# Patient Record
Sex: Male | Born: 1985 | Race: White | Hispanic: No | Marital: Married | Smoking: Never smoker
Health system: Southern US, Community
[De-identification: ages and names within clinical notes are randomized; demographics above are authoritative.]

## PROBLEM LIST (undated history)

## (undated) DIAGNOSIS — E7800 Pure hypercholesterolemia, unspecified: Secondary | ICD-10-CM

## (undated) DIAGNOSIS — M6740 Ganglion, unspecified site: Secondary | ICD-10-CM

## (undated) DIAGNOSIS — H6120 Impacted cerumen, unspecified ear: Secondary | ICD-10-CM

## (undated) HISTORY — DX: Ganglion, unspecified site: M67.40

## (undated) HISTORY — DX: Pure hypercholesterolemia, unspecified: E78.00

## (undated) HISTORY — DX: Impacted cerumen, unspecified ear: H61.20

---

## 2008-12-26 ENCOUNTER — Inpatient Hospital Stay: Payer: Self-pay | Admitting: Internal Medicine

## 2009-01-08 ENCOUNTER — Ambulatory Visit: Payer: Self-pay | Admitting: General Practice

## 2011-02-28 IMAGING — US ABDOMEN ULTRASOUND
1 series · 13 of 25 positions shown · non-contrast
Comparison: none

REASON FOR EXAM: abd pain  EVAL gallbadder
COMMENTS:

[Series 1: abdomen ultrasound · 0.35mm/px · 13 of 57 slices shown]
[im 1/57]
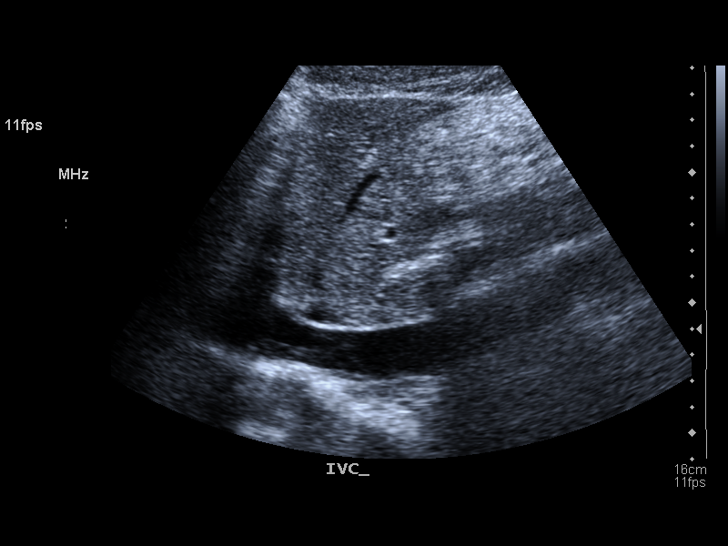
[im 5/57]
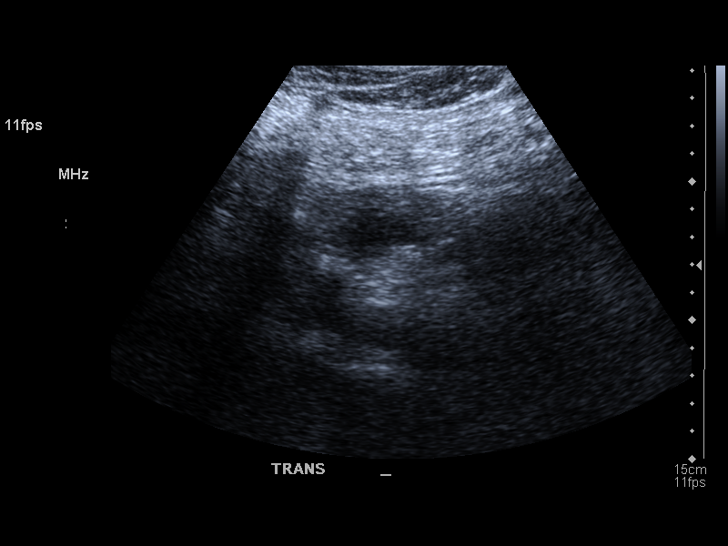
[im 10/57]
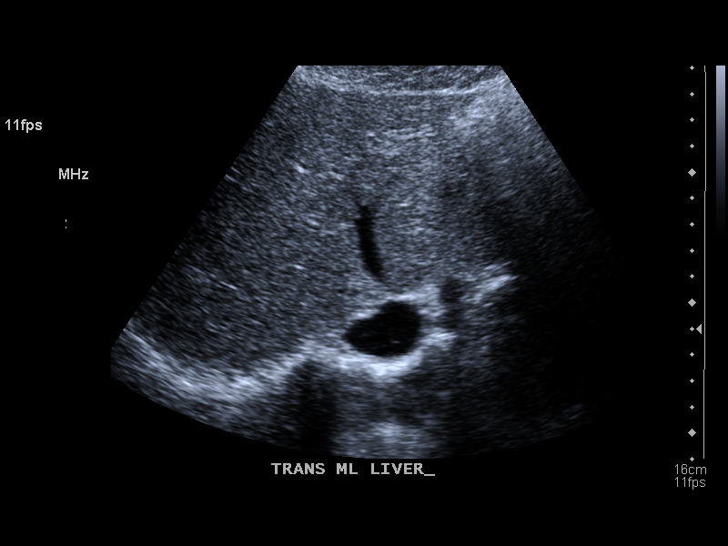
[im 15/57]
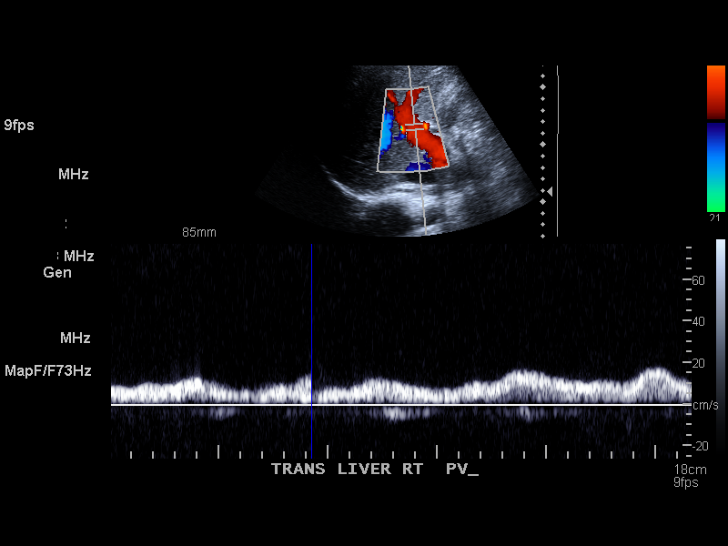
[im 19/57]
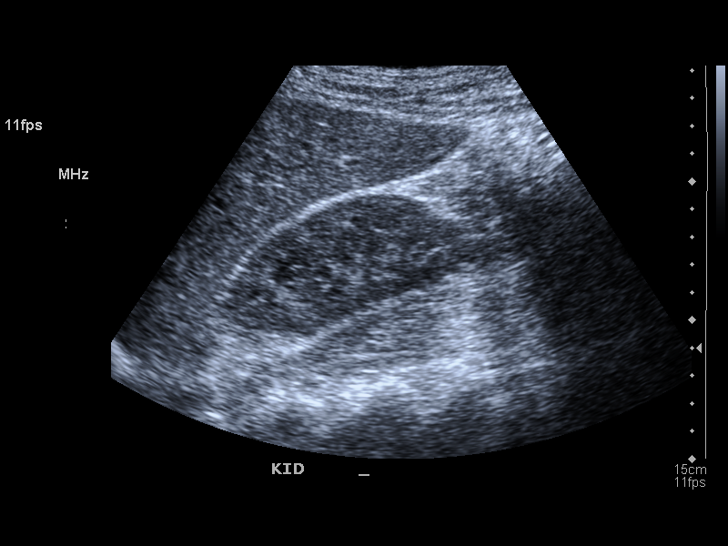
[im 24/57]
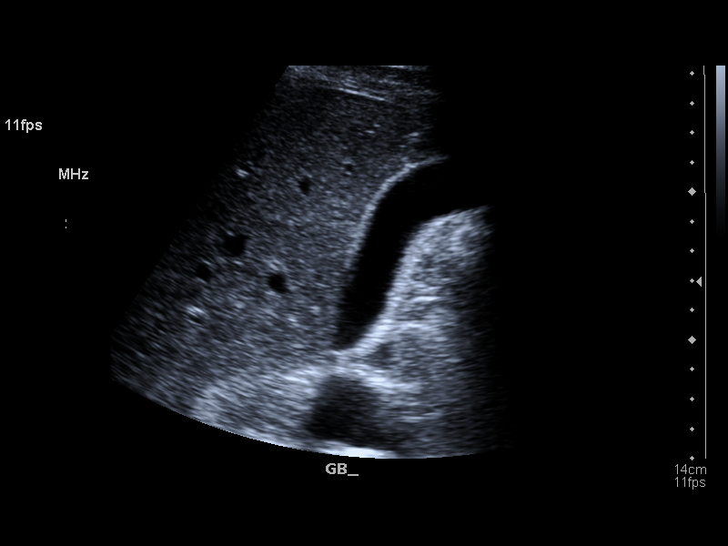
[im 29/57]
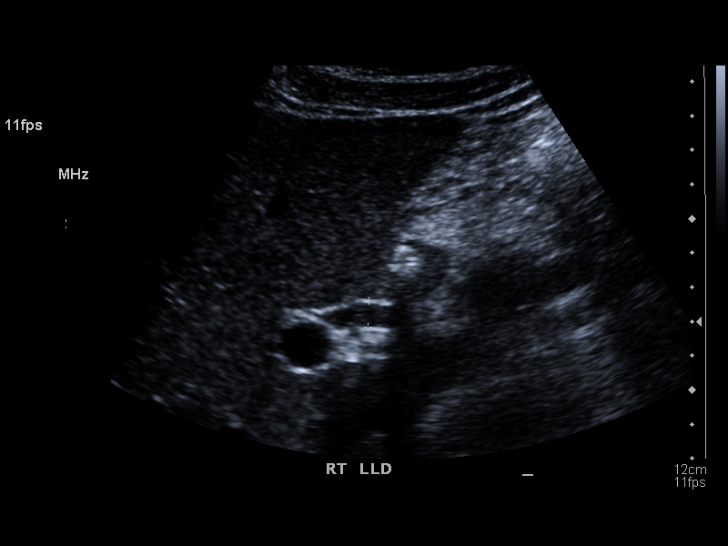
[im 33/57]
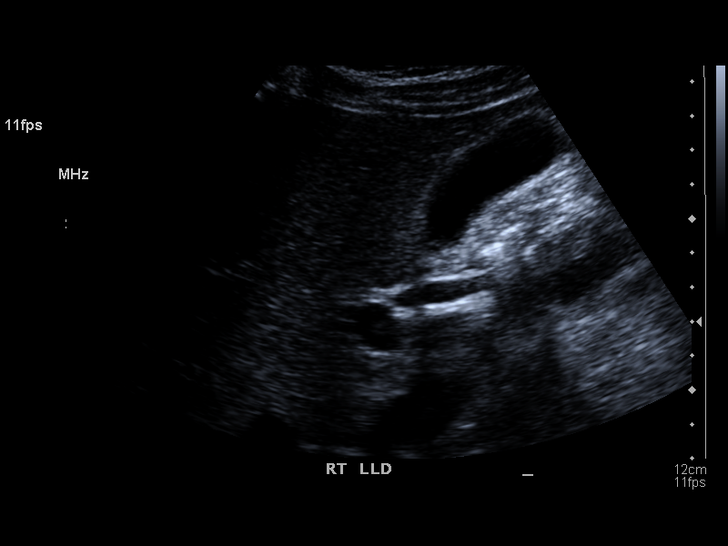
[im 38/57]
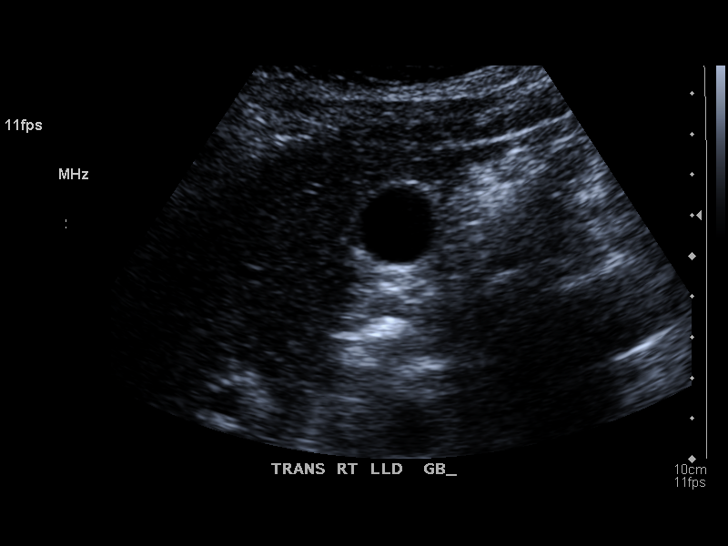
[im 43/57]
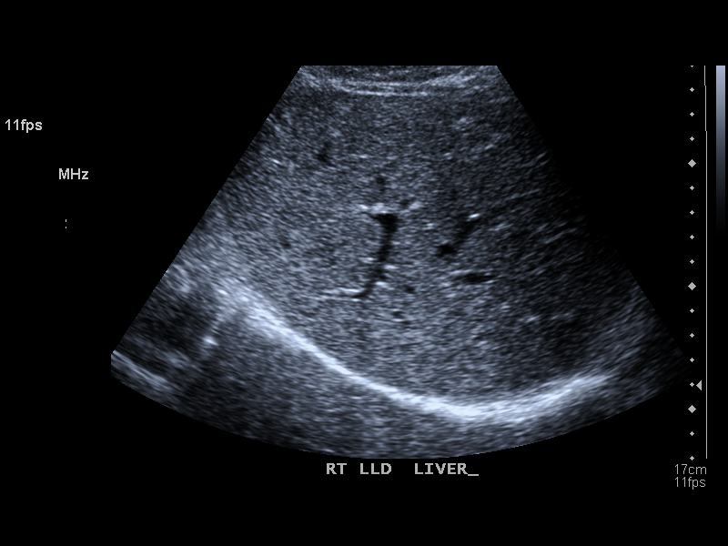
[im 47/57]
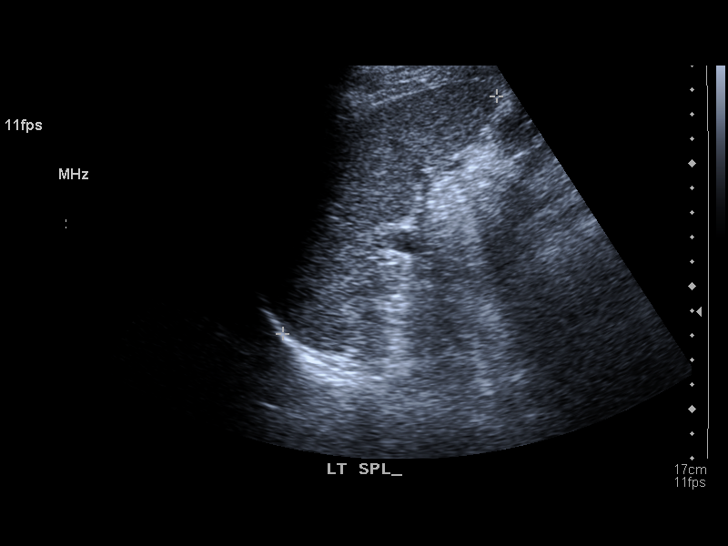
[im 52/57]
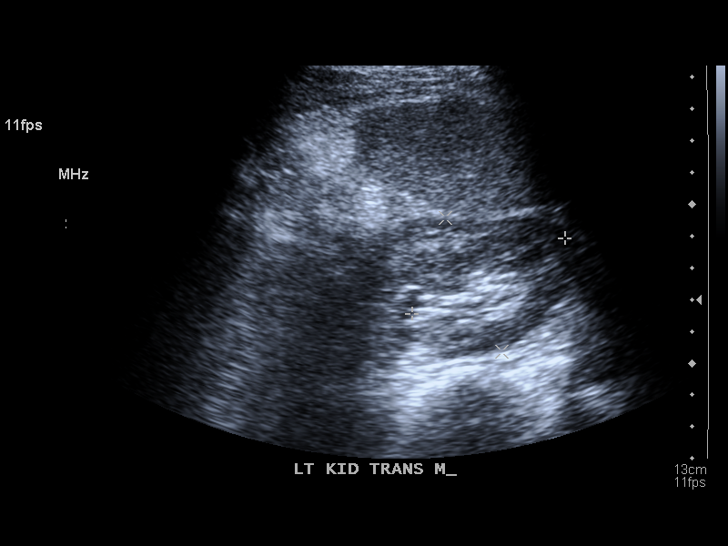
[im 57/57]
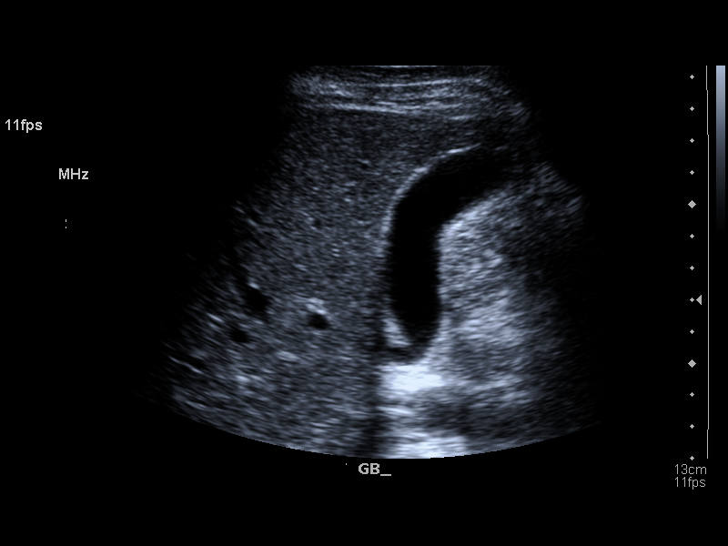

[13 of 25 positions shown; findings below may reference images not displayed]

PROCEDURE:     US  - US ABDOMEN GENERAL SURVEY  - January 08, 2009  [DATE]

RESULT:     The liver, pancreas, abdominal aorta and inferior vena cava show
no significant abnormalities. The spleen is upper limits for normal in size
and measures approximately 13 cm in AP diameter. No gallstones are seen.
There is no thickening of the gallbladder wall. The common bile duct on this
exam measures 7.7 mm at maximum diameter. This is large for the patient's
age. The common duct measured 9 mm at maximum diameter on the prior exam of
12/27/2008 but the difference observed is likely due to difference in
measurement technique. If there are laboratory findings to suggest partial
common duct obstruction then further evaluation by biliary nuclide scan is
recommended. The kidneys show no hydronephrosis. There is no ascites.
IMPRESSION: 1.  There persists dilatation of the common bile duct. If there are
laboratory values suggesting common duct obstruction then further evaluation
by biliary nuclide scan is recommended.
2.  No gallstones are seen.
3.  There is no thickening of the gallbladder wall.
4.  The spleen is upper limits for normal in size or slightly enlarged but
is stable as compared to the prior exam.

## 2013-09-13 ENCOUNTER — Emergency Department: Payer: Self-pay | Admitting: Emergency Medicine

## 2013-09-13 LAB — COMPREHENSIVE METABOLIC PANEL
Albumin: 4.3 g/dL (ref 3.4–5.0)
Alkaline Phosphatase: 65 U/L
BUN: 21 mg/dL — ABNORMAL HIGH (ref 7–18)
Bilirubin,Total: 1.3 mg/dL — ABNORMAL HIGH (ref 0.2–1.0)
Calcium, Total: 9.2 mg/dL (ref 8.5–10.1)
Chloride: 105 mmol/L (ref 98–107)
EGFR (African American): >60
EGFR (Non-African Amer.): >60
Potassium: 4 mmol/L (ref 3.5–5.1)
SGOT(AST): 17 U/L (ref 15–37)
SGPT (ALT): 35 U/L (ref 12–78)
Sodium: 137 mmol/L (ref 136–145)
Total Protein: 7.6 g/dL (ref 6.4–8.2)

## 2013-09-13 LAB — CBC WITH DIFFERENTIAL/PLATELET
Basophil #: 0 10*3/uL (ref 0.0–0.1)
Basophil %: 0.2 %
Eosinophil #: 0 10*3/uL (ref 0.0–0.7)
Eosinophil %: 0.4 %
HGB: 16 g/dL (ref 13.0–18.0)
Lymphocyte #: 0.1 10*3/uL — ABNORMAL LOW (ref 1.0–3.6)
MCHC: 34.4 g/dL (ref 32.0–36.0)
Monocyte %: 4.3 %
Neutrophil #: 8.4 10*3/uL — ABNORMAL HIGH (ref 1.4–6.5)
Neutrophil %: 93.5 %
Platelet: 195 10*3/uL (ref 150–440)
RBC: 5.61 10*6/uL (ref 4.40–5.90)
RDW: 13 % (ref 11.5–14.5)
WBC: 8.9 10*3/uL (ref 3.8–10.6)

## 2013-09-13 LAB — LIPASE, BLOOD: Lipase: 74 U/L (ref 73–393)

## 2017-04-19 ENCOUNTER — Encounter: Payer: Self-pay | Admitting: Emergency Medicine

## 2017-04-19 ENCOUNTER — Ambulatory Visit
Admission: EM | Admit: 2017-04-19 | Discharge: 2017-04-19 | Disposition: A | Discharge status: Home / Self Care | Payer: BLUE CROSS/BLUE SHIELD | Attending: Family Medicine | Admitting: Family Medicine

## 2017-04-19 DIAGNOSIS — S80862A Insect bite (nonvenomous), left lower leg, initial encounter: Secondary | ICD-10-CM | POA: Diagnosis not present

## 2017-04-19 DIAGNOSIS — L03116 Cellulitis of left lower limb: Secondary | ICD-10-CM

## 2017-04-19 DIAGNOSIS — W57XXXA Bitten or stung by nonvenomous insect and other nonvenomous arthropods, initial encounter: Secondary | ICD-10-CM

## 2017-04-19 LAB — CBC WITH DIFFERENTIAL/PLATELET
Basophils Absolute: 0 10*3/uL (ref 0–0.1)
Basophils Relative: 1 %
Eosinophils Absolute: 0 10*3/uL (ref 0–0.7)
Eosinophils Relative: 1 %
HCT: 44.9 % (ref 40.0–52.0)
Hemoglobin: 15.4 g/dL (ref 13.0–18.0)
Lymphocytes Relative: 13 %
Lymphs Abs: 0.7 10*3/uL — ABNORMAL LOW (ref 1.0–3.6)
MCH: 28.6 pg (ref 26.0–34.0)
MCHC: 34.4 g/dL (ref 32.0–36.0)
MCV: 83.1 fL (ref 80.0–100.0)
Monocytes Absolute: 0.7 10*3/uL (ref 0.2–1.0)
Monocytes Relative: 13 %
Neutro Abs: 3.7 10*3/uL (ref 1.4–6.5)
Neutrophils Relative %: 72 %
Platelets: 186 10*3/uL (ref 150–440)
RBC: 5.41 MIL/uL (ref 4.40–5.90)
RDW: 12.8 % (ref 11.5–14.5)
WBC: 5.2 10*3/uL (ref 3.8–10.6)

## 2017-04-19 MED ORDER — CEFUROXIME AXETIL 500 MG PO TABS: 500.0000 mg | ORAL_TABLET | Freq: Two times a day (BID) | ORAL | 0 refills | Status: AC

## 2017-04-19 MED ORDER — MUPIROCIN 2 % EX OINT: 1.0000 "application " | TOPICAL_OINTMENT | Freq: Two times a day (BID) | CUTANEOUS | 0 refills | Status: AC

## 2017-04-19 MED ORDER — SULFAMETHOXAZOLE-TRIMETHOPRIM 800-160 MG PO TABS: 1.0000 | ORAL_TABLET | Freq: Two times a day (BID) | ORAL | 0 refills | Status: AC

## 2017-04-19 MED ORDER — CEFTRIAXONE SODIUM 1 G IJ SOLR
1.0000 g | Freq: Once | INTRAMUSCULAR | Status: AC
  Administered 2017-04-19: 1 g via INTRAMUSCULAR

## 2017-04-19 NOTE — ED Provider Notes (Signed)
CSN: [id number]     Arrival date & time 04/19/17  1652 History   None    Chief Complaint  Patient presents with  . Insect Bite   (Consider location/radiation/quality/duration/timing/severity/associated sxs/prior Treatment) 31 yr old male pt [facility]  [facility] with cc of bug bite to left anterior shin ~ 2 weeks prior, tx with keflex x 4 days with no improvement, pt reports HA and fever now.    The history is provided by the patient. No language interpreter was used.    History reviewed. No pertinent past medical history. History reviewed. No pertinent surgical history. History reviewed. No pertinent family history. Social History  Substance Use Topics  . Smoking status: Never Smoker  . Smokeless tobacco: Never Used  . Alcohol use Yes    Review of Systems  Constitutional: Negative for chills and fever.  HENT: Negative for ear pain and sore throat.   Eyes: Negative for pain and visual disturbance.  Respiratory: Negative for cough and shortness of breath.   Cardiovascular: Negative for chest pain and palpitations.  Gastrointestinal: Negative for abdominal pain and vomiting.  Genitourinary: Negative for dysuria and hematuria.  Musculoskeletal: Negative for arthralgias and back pain.  Skin: Negative for color change and rash.  Neurological: Negative for seizures and syncope.  All other systems reviewed and are negative.   Allergies  Patient has no known allergies.  Home Medications   Prior to Admission medications   Medication Sig Start Date End Date Taking? Authorizing Provider  cefUROXime (CEFTIN) 500 MG tablet Take 1 tablet (500 mg total) by mouth 2 (two) times daily with a meal. 04/19/17 04/29/17  Victoria Euceda, Para March, NP  mupirocin ointment (BACTROBAN) 2 % Apply 1 application topically 2 (two) times daily. 04/19/17 04/29/17  Audryanna Zurita, Para March, NP  sulfamethoxazole-trimethoprim (BACTRIM DS,SEPTRA DS) 800-160 MG tablet Take 1 tablet by mouth 2 (two) times daily. 04/19/17 04/29/17   Jacey Pelc, Para March, NP   Meds Ordered and Administered this Visit   Medications  cefTRIAXone (ROCEPHIN) injection 1 g (1 g Intramuscular Given 04/19/17 1751)    BP 117/76 (BP Location: Left Arm)   Pulse 95   Temp 100.3 F (37.9 C) (Oral)   Resp 16   Ht 6' (1.829 m)   Wt 160 lb (72.6 kg)   SpO2 100%   BMI 21.70 kg/m  No data found.   Physical Exam  Constitutional: He is oriented to person, place, and time. He appears well-developed and well-nourished. He is active and cooperative.  HENT:  Head: Normocephalic and atraumatic.  Eyes: Conjunctivae are normal.  Neck: Neck supple.  Cardiovascular: Normal rate, regular rhythm and normal pulses.   No murmur heard. Pulses:      Dorsalis pedis pulses are 2+ on the right side, and 2+ on the left side.  Pulmonary/Chest: Effort normal and breath sounds normal. No respiratory distress.  Abdominal: Soft. There is no tenderness.  Musculoskeletal: He exhibits no edema.  Neurological: He is alert and oriented to person, place, and time. GCS eye subscore is 4. GCS verbal subscore is 5. GCS motor subscore is 6.  Skin: Skin is warm and dry. Lesion noted.     Psychiatric: He has a normal mood and affect. His speech is normal and behavior is normal.  Nursing note and vitals reviewed.   Urgent Care Course     Procedures (including critical care time)  Labs Review Labs Reviewed  CBC WITH DIFFERENTIAL/PLATELET - Abnormal; Notable for the following:       Result Value  Lymphs Abs 0.7 (*)    All other components within normal limits    Imaging Review No results found.       MDM   1. Insect bite, initial encounter   2. Cellulitis of left lower extremity    Stop Keflex switching to Ceftin and Septra, apply mupirocin abx ointment topically bid to affected area.. Your CBC was normal. Follow up with PCP for recheck in 2 days, sooner if worse.    Clancy Gourdefelice, Amyla Heffner, NP 04/19/17 1943

## 2017-04-19 NOTE — ED Triage Notes (Signed)
Patient c/o insect bite for the past 2 weeks ago on his left lower leg.  Patient started on an antibiotic since Friday.  Patient reports chills and HAs.

## 2017-04-19 NOTE — Discharge Instructions (Addendum)
Stop Keflex switching to Ceftin and Septra, apply mupirocin abx ointment topically bid to affected area.. Your CBC was normal. Follow up with PCP for recheck in 2 days, sooner if worse.

## 2019-07-17 ENCOUNTER — Ambulatory Visit: Payer: Self-pay

## 2019-07-17 DIAGNOSIS — Z23 Encounter for immunization: Secondary | ICD-10-CM

## 2020-01-22 DIAGNOSIS — H5213 Myopia, bilateral: Secondary | ICD-10-CM | POA: Diagnosis not present

## 2020-02-11 ENCOUNTER — Other Ambulatory Visit: Payer: Self-pay

## 2020-02-11 ENCOUNTER — Encounter: Payer: Self-pay | Admitting: Emergency Medicine

## 2020-02-11 ENCOUNTER — Emergency Department
Admission: EM | Admit: 2020-02-11 | Discharge: 2020-02-11 | Disposition: A | Discharge status: Home / Self Care | Payer: 59 | Attending: Emergency Medicine | Admitting: Emergency Medicine

## 2020-02-11 DIAGNOSIS — R52 Pain, unspecified: Secondary | ICD-10-CM | POA: Diagnosis not present

## 2020-02-11 DIAGNOSIS — I959 Hypotension, unspecified: Secondary | ICD-10-CM | POA: Diagnosis not present

## 2020-02-11 DIAGNOSIS — R1110 Vomiting, unspecified: Secondary | ICD-10-CM | POA: Diagnosis present

## 2020-02-11 DIAGNOSIS — R000 Tachycardia, unspecified: Secondary | ICD-10-CM | POA: Diagnosis not present

## 2020-02-11 DIAGNOSIS — R197 Diarrhea, unspecified: Secondary | ICD-10-CM | POA: Diagnosis not present

## 2020-02-11 DIAGNOSIS — R112 Nausea with vomiting, unspecified: Secondary | ICD-10-CM | POA: Insufficient documentation

## 2020-02-11 DIAGNOSIS — R1084 Generalized abdominal pain: Secondary | ICD-10-CM | POA: Diagnosis not present

## 2020-02-11 DIAGNOSIS — R1111 Vomiting without nausea: Secondary | ICD-10-CM | POA: Diagnosis not present

## 2020-02-11 DIAGNOSIS — K529 Noninfective gastroenteritis and colitis, unspecified: Secondary | ICD-10-CM | POA: Diagnosis not present

## 2020-02-11 LAB — CBC
HCT: 48.2 % (ref 39.0–52.0)
Hemoglobin: 16.9 g/dL (ref 13.0–17.0)
MCH: 28.4 pg (ref 26.0–34.0)
MCHC: 35.1 g/dL (ref 30.0–36.0)
MCV: 80.9 fL (ref 80.0–100.0)
Platelets: 287 10*3/uL (ref 150–400)
RBC: 5.96 MIL/uL — ABNORMAL HIGH (ref 4.22–5.81)
RDW: 12.3 % (ref 11.5–15.5)
WBC: 9.9 10*3/uL (ref 4.0–10.5)
nRBC: 0 % (ref 0.0–0.2)

## 2020-02-11 LAB — COMPREHENSIVE METABOLIC PANEL
ALT: 30 U/L (ref 0–44)
AST: 23 U/L (ref 15–41)
Albumin: 4.5 g/dL (ref 3.5–5.0)
Alkaline Phosphatase: 44 U/L (ref 38–126)
Anion gap: 9 (ref 5–15)
BUN: 23 mg/dL — ABNORMAL HIGH (ref 6–20)
CO2: 22 mmol/L (ref 22–32)
Calcium: 8.3 mg/dL — ABNORMAL LOW (ref 8.9–10.3)
Chloride: 111 mmol/L (ref 98–111)
Creatinine, Ser: 1.17 mg/dL (ref 0.61–1.24)
GFR calc Af Amer: >60 mL/min (ref >60)
GFR calc non Af Amer: >60 mL/min (ref >60)
Glucose, Bld: 148 mg/dL — ABNORMAL HIGH (ref 70–99)
Potassium: 3.5 mmol/L (ref 3.5–5.1)
Sodium: 142 mmol/L (ref 135–145)
Total Bilirubin: 1.6 mg/dL — ABNORMAL HIGH (ref 0.3–1.2)
Total Protein: 7.4 g/dL (ref 6.5–8.1)

## 2020-02-11 LAB — LIPASE, BLOOD: Lipase: 18 U/L (ref 11–51)

## 2020-02-11 MED ORDER — SODIUM CHLORIDE 0.9 % IV BOLUS
1000.0000 mL | Freq: Once | INTRAVENOUS | Status: AC
  Administered 2020-02-11: 1000 mL via INTRAVENOUS

## 2020-02-11 MED ORDER — SODIUM CHLORIDE 0.9 % IV BOLUS
1000.0000 mL | Freq: Once | INTRAVENOUS | Status: AC
  Administered 2020-02-11: 07:00:00 1000 mL via INTRAVENOUS

## 2020-02-11 MED ORDER — PROMETHAZINE HCL 25 MG/ML IJ SOLN
25.0000 mg | Freq: Once | INTRAMUSCULAR | Status: AC
  Administered 2020-02-11: 08:00:00 25 mg via INTRAVENOUS
  Filled 2020-02-11: qty 1

## 2020-02-11 MED ORDER — ONDANSETRON HCL 4 MG/2ML IJ SOLN
4.0000 mg | Freq: Once | INTRAMUSCULAR | Status: AC | PRN
  Administered 2020-02-11: 07:00:00 4 mg via INTRAVENOUS
  Filled 2020-02-11: qty 2

## 2020-02-11 MED ORDER — MORPHINE SULFATE (PF) 4 MG/ML IV SOLN
4.0000 mg | Freq: Once | INTRAVENOUS | Status: AC
  Administered 2020-02-11: 09:00:00 4 mg via INTRAVENOUS
  Filled 2020-02-11: qty 1

## 2020-02-11 MED ORDER — PROMETHAZINE HCL 25 MG PO TABS: 25.0000 mg | ORAL_TABLET | Freq: Four times a day (QID) | ORAL | 0 refills | Status: DC | PRN

## 2020-02-11 NOTE — ED Notes (Signed)
Family at bedside. 

## 2020-02-11 NOTE — ED Triage Notes (Signed)
Pt arrives via [facility] with N/V/D all night. Per EMS, VS WDL. Pt is currently sticking his finger down his throat to induce more vomiting. Pt is in NAD.

## 2020-02-11 NOTE — ED Provider Notes (Signed)
[facility] Emergency Department Provider Note  Time seen: 8:15 AM  I have reviewed the triage vital signs and the nursing notes.   HISTORY  Chief Complaint Emesis  HPI Randy Brewer is a 34 y.o. male with no significant past medical history presents to the emergency department for nausea vomiting and diarrhea.  According to the patient his daughter developed vomiting and diarrhea on Friday.  States he felt well until approximately 8 hours ago when he awoke from his sleep with nausea vomiting diarrhea.  States he has been up all night vomiting.  Has diffuse abdominal pain/cramping, but states the nausea and vomiting are not resolving.  Patient states he has been trying to make himself vomit because it seems to improve his symptoms release momentarily.  States he is also been experiencing diarrhea overnight.  No fever.  No cough.   History reviewed. No pertinent past medical history.  There are no problems to display for this patient.   History reviewed. No pertinent surgical history.  Prior to Admission medications   Not on File    No Known Allergies  No family history on file.  Social History Social History   Tobacco Use  . Smoking status: Never Smoker  . Smokeless tobacco: Never Used  Substance Use Topics  . Alcohol use: Yes  . Drug use: Never    Review of Systems Constitutional: Negative for fever. Cardiovascular: Negative for chest pain. Respiratory: Negative for shortness of breath. Gastrointestinal: Diffuse abdominal cramping.  Positive for nausea vomiting diarrhea Musculoskeletal: Negative for musculoskeletal complaints Neurological: Negative for headache All other ROS negative  ____________________________________________   PHYSICAL EXAM:  VITAL SIGNS: ED Triage Vitals [02/11/20 0630]  Enc Vitals Group     BP 106/73     Pulse Rate (!) 112     Resp 20     Temp 97.6 F (36.4 C)     Temp Source Oral     SpO2 96 %   Weight 165 lb (74.8 kg)     Height 5\' 11"  (1.803 m)     Head Circumference      Peak Flow      Pain Score      Pain Loc      Pain Edu?      Excl. in [place]?    Constitutional: Alert and oriented. Well appearing and in no distress. Eyes: Normal exam ENT      Head: Normocephalic and atraumatic.      Mouth/Throat: Mucous membranes are moist. Cardiovascular: Normal rate, regular rhythm.  Respiratory: Normal respiratory effort without tachypnea nor retractions. Breath sounds are clear Gastrointestinal: Soft, mild diffuse tenderness.  No rebound guarding or distention. Musculoskeletal: Nontender with normal range of motion in all extremities.  Neurologic:  Normal speech and language. No gross focal neurologic deficits  Skin:  Skin is warm, dry and intact.  Psychiatric: Mood and affect are normal.   ____________________________________________   INITIAL IMPRESSION / ASSESSMENT AND PLAN / ED COURSE  Pertinent labs & imaging results that were available during my care of the patient were reviewed by me and considered in my medical decision making (see chart for details).   Patient presents emergency department for nausea vomiting diarrhea.  Patient's symptoms started approximately 2 to 3 days after his daughter was sick with the same symptoms.  They were told that it is similar illnesses going around the daughter's daycare.  Patient symptoms are very suggestive of gastroenteritis possible norovirus.  Patient has no focal  area of pain identified on exam.  Does appear dehydrated on lab work we will dose IV fluids.  We will dose pain and nausea medication.  If the patient can provide a stool sample we will test.  Patient is feeling better, has not been able to provide a stool sample.  We will discharge with nausea medication and supportive care.  Discussed return precautions.  Randy Brewer was evaluated in Emergency Department on 02/11/2020 for the symptoms described in the history of present  illness. He was evaluated in the context of the global COVID-19 pandemic, which necessitated consideration that the patient might be at risk for infection with the SARS-CoV-2 virus that causes COVID-19. Institutional protocols and algorithms that pertain to the evaluation of patients at risk for COVID-19 are in a state of rapid change based on information released by regulatory bodies including the CDC and federal and state organizations. These policies and algorithms were followed during the patient's care in the ED.  ____________________________________________   FINAL CLINICAL IMPRESSION(S) / ED DIAGNOSES  Gastroenteritis   Minna Antis, MD 02/11/20 1317

## 2020-02-13 ENCOUNTER — Telehealth: Payer: Self-pay

## 2020-02-13 NOTE — Telephone Encounter (Signed)
Received [facility] ED notes dated 02/11/20. Notes reviewed 02/12/20 by Durward Parcel, PA-C.  AMD

## 2020-03-24 ENCOUNTER — Other Ambulatory Visit: Payer: Self-pay

## 2020-03-24 ENCOUNTER — Ambulatory Visit: Payer: Self-pay

## 2020-03-24 DIAGNOSIS — Z0000 Encounter for general adult medical examination without abnormal findings: Secondary | ICD-10-CM

## 2020-03-24 LAB — POCT URINALYSIS DIPSTICK
Bilirubin, UA: NEGATIVE
Blood, UA: NEGATIVE
Glucose, UA: NEGATIVE
Ketones, UA: NEGATIVE
Leukocytes, UA: NEGATIVE
Nitrite, UA: NEGATIVE
Protein, UA: NEGATIVE
Spec Grav, UA: 1.02 (ref 1.010–1.025)
Urobilinogen, UA: 0.2 E.U./dL
pH, UA: 6.5 (ref 5.0–8.0)

## 2020-03-24 NOTE — Progress Notes (Signed)
Scheduled to complete physical 04/02/20 with Ron Smith, PA-C.  AMD 

## 2020-03-25 LAB — CMP12+LP+TP+TSH+6AC+CBC/D/PLT
ALT: 16 IU/L (ref 0–44)
AST: 17 IU/L (ref 0–40)
Albumin/Globulin Ratio: 2 (ref 1.2–2.2)
Albumin: 4.8 g/dL (ref 4.0–5.0)
Alkaline Phosphatase: 62 IU/L (ref 48–121)
BUN/Creatinine Ratio: 14 (ref 9–20)
BUN: 15 mg/dL (ref 6–20)
Basophils Absolute: 0.1 10*3/uL (ref 0.0–0.2)
Basos: 1 %
Bilirubin Total: 0.9 mg/dL (ref 0.0–1.2)
Calcium: 9.5 mg/dL (ref 8.7–10.2)
Chloride: 100 mmol/L (ref 96–106)
Chol/HDL Ratio: 4.2 ratio (ref 0.0–5.0)
Cholesterol, Total: 212 mg/dL — ABNORMAL HIGH (ref 100–199)
Creatinine, Ser: 1.06 mg/dL (ref 0.76–1.27)
EOS (ABSOLUTE): 0.2 10*3/uL (ref 0.0–0.4)
Eos: 5 %
Estimated CHD Risk: 0.8 times avg. (ref 0.0–1.0)
Free Thyroxine Index: 2.5 (ref 1.2–4.9)
GFR calc Af Amer: 104 mL/min/{1.73_m2} (ref >59)
GFR calc non Af Amer: 90 mL/min/{1.73_m2} (ref >59)
GGT: 21 IU/L (ref 0–65)
Globulin, Total: 2.4 g/dL (ref 1.5–4.5)
Glucose: 85 mg/dL (ref 65–99)
HDL: 51 mg/dL (ref >39)
Hematocrit: 46.2 % (ref 37.5–51.0)
Hemoglobin: 15.8 g/dL (ref 13.0–17.7)
Immature Grans (Abs): 0 10*3/uL (ref 0.0–0.1)
Immature Granulocytes: 0 %
Iron: 113 ug/dL (ref 38–169)
LDH: 152 IU/L (ref 121–224)
LDL Chol Calc (NIH): 144 mg/dL — ABNORMAL HIGH (ref 0–99)
Lymphocytes Absolute: 1.3 10*3/uL (ref 0.7–3.1)
Lymphs: 25 %
MCH: 28.7 pg (ref 26.6–33.0)
MCHC: 34.2 g/dL (ref 31.5–35.7)
MCV: 84 fL (ref 79–97)
Monocytes Absolute: 0.4 10*3/uL (ref 0.1–0.9)
Monocytes: 7 %
Neutrophils Absolute: 3.3 10*3/uL (ref 1.4–7.0)
Neutrophils: 62 %
Phosphorus: 3 mg/dL (ref 2.8–4.1)
Platelets: 305 10*3/uL (ref 150–450)
Potassium: 4.4 mmol/L (ref 3.5–5.2)
RBC: 5.5 x10E6/uL (ref 4.14–5.80)
RDW: 12.5 % (ref 11.6–15.4)
Sodium: 138 mmol/L (ref 134–144)
T3 Uptake Ratio: 29 % (ref 24–39)
T4, Total: 8.5 ug/dL (ref 4.5–12.0)
TSH: 1.36 u[IU]/mL (ref 0.450–4.500)
Total Protein: 7.2 g/dL (ref 6.0–8.5)
Triglycerides: 94 mg/dL (ref 0–149)
Uric Acid: 4.7 mg/dL (ref 3.8–8.4)
VLDL Cholesterol Cal: 17 mg/dL (ref 5–40)
WBC: 5.2 10*3/uL (ref 3.4–10.8)

## 2020-04-02 ENCOUNTER — Encounter: Payer: Self-pay | Admitting: Physician Assistant

## 2020-04-02 ENCOUNTER — Other Ambulatory Visit: Payer: Self-pay

## 2020-04-02 ENCOUNTER — Ambulatory Visit: Payer: Self-pay | Admitting: Physician Assistant

## 2020-04-02 VITALS — BP 116/80 | HR 66 | Temp 97.5°F | Resp 12 | Ht 72.0 in | Wt 162.0 lb

## 2020-04-02 DIAGNOSIS — L255 Unspecified contact dermatitis due to plants, except food: Secondary | ICD-10-CM

## 2020-04-02 DIAGNOSIS — Z0000 Encounter for general adult medical examination without abnormal findings: Secondary | ICD-10-CM

## 2020-04-02 MED ORDER — METHYLPREDNISOLONE 4 MG PO TBPK: ORAL_TABLET | ORAL | 0 refills | Status: DC

## 2020-04-02 MED ORDER — METHYLPREDNISOLONE ACETATE 80 MG/ML IJ SUSP
80.0000 mg | Freq: Once | INTRAMUSCULAR | Status: AC
  Administered 2020-04-02: 80 mg via INTRAMUSCULAR

## 2020-04-02 MED ORDER — HYDROXYZINE HCL 50 MG PO TABS: 50.0000 mg | ORAL_TABLET | Freq: Three times a day (TID) | ORAL | 0 refills | Status: DC | PRN

## 2020-04-02 NOTE — Progress Notes (Signed)
40 mg (0/5 ml) of Depo Medrol administered in right deltoid for poison oak as ordered by Ron Smith,PA-C. 80 mg/ml vial used - only strength available in clinic.  Used 1/2 the dose.  AMD

## 2020-04-02 NOTE — Progress Notes (Signed)
   Subjective: Annual physical exam    Patient ID: Randy Brewer, male    DOB: [birth year], 34 y.o.   MRN: [id number]  HPI Patient presents for annual physical exam.  Patient voiced no concerns or complaints.   Review of Systems Rash right lower leg.    Objective:   Physical Exam No acute distress.  HEENT unremarkable.  Neck is supple for adenopathy or bruits.  Lungs are clear to auscultation.  Heart is regular rate and rhythm.  Abdomen with negative HSM, normoactive bowel sounds, soft, nontender to palpation.  No obvious deformity to the upper or lower extremities.  Patient has full and equal range of motion of the upper and lower extremities.  No obvious deformity to cervical and lumbar spine.  Patient has full and equal range of motion of the cervical lumbar spine.  Cranial nerves II through XII grossly intact.  Patient has vesicular lesions on erythematous base right lower leg.         Assessment & Plan: Well exam Discussed lab results and advised diet and exercise secondary to increase of cholesterol to 212.  Rash secondary to contact dermatitis.  Patient given a prescription for Medrol Dosepak and Atarax.  Patient was given Depo-Medrol prior to departure.

## 2020-07-24 ENCOUNTER — Other Ambulatory Visit: Payer: Self-pay

## 2020-07-24 DIAGNOSIS — Z1152 Encounter for screening for COVID-19: Secondary | ICD-10-CM

## 2020-07-24 NOTE — Addendum Note (Signed)
Addended by: Christianne Dolin F on: 07/24/2020 10:17 AM   Modules accepted: Orders

## 2020-07-26 LAB — SARS-COV-2, NAA 2 DAY TAT

## 2020-07-26 LAB — NOVEL CORONAVIRUS, NAA: SARS-CoV-2, NAA: NOT DETECTED

## 2021-03-12 NOTE — Progress Notes (Signed)
Scheduled to complete physical 03/19/21 with Blima Ledger, NP-C.  AMD

## 2021-03-13 ENCOUNTER — Ambulatory Visit: Payer: Self-pay

## 2021-03-13 ENCOUNTER — Other Ambulatory Visit: Payer: Self-pay

## 2021-03-13 DIAGNOSIS — Z0000 Encounter for general adult medical examination without abnormal findings: Secondary | ICD-10-CM

## 2021-03-13 LAB — POCT URINALYSIS DIPSTICK
Bilirubin, UA: NEGATIVE
Blood, UA: NEGATIVE
Glucose, UA: NEGATIVE
Ketones, UA: NEGATIVE
Leukocytes, UA: NEGATIVE
Nitrite, UA: NEGATIVE
Protein, UA: NEGATIVE
Spec Grav, UA: 1.025 (ref 1.010–1.025)
Urobilinogen, UA: 0.2 E.U./dL
pH, UA: 6 (ref 5.0–8.0)

## 2021-03-14 LAB — CMP12+LP+TP+TSH+6AC+CBC/D/PLT
ALT: 21 IU/L (ref 0–44)
AST: 18 IU/L (ref 0–40)
Albumin/Globulin Ratio: 2 (ref 1.2–2.2)
Albumin: 5.1 g/dL — ABNORMAL HIGH (ref 4.0–5.0)
Alkaline Phosphatase: 69 IU/L (ref 44–121)
BUN/Creatinine Ratio: 14 (ref 9–20)
BUN: 15 mg/dL (ref 6–20)
Basophils Absolute: 0.1 x10E3/uL (ref 0.0–0.2)
Basos: 1 %
Bilirubin Total: 0.7 mg/dL (ref 0.0–1.2)
Calcium: 9.5 mg/dL (ref 8.7–10.2)
Chloride: 99 mmol/L (ref 96–106)
Chol/HDL Ratio: 4.1 ratio (ref 0.0–5.0)
Cholesterol, Total: 214 mg/dL — ABNORMAL HIGH (ref 100–199)
Creatinine, Ser: 1.06 mg/dL (ref 0.76–1.27)
EOS (ABSOLUTE): 0.2 x10E3/uL (ref 0.0–0.4)
Eos: 3 %
Estimated CHD Risk: 0.8 times avg. (ref 0.0–1.0)
Free Thyroxine Index: 2.7 (ref 1.2–4.9)
GGT: 22 IU/L (ref 0–65)
Globulin, Total: 2.6 g/dL (ref 1.5–4.5)
Glucose: 89 mg/dL (ref 65–99)
HDL: 52 mg/dL
Hematocrit: 48.6 % (ref 37.5–51.0)
Hemoglobin: 16.2 g/dL (ref 13.0–17.7)
Immature Grans (Abs): 0 x10E3/uL (ref 0.0–0.1)
Immature Granulocytes: 0 %
Iron: 109 ug/dL (ref 38–169)
LDH: 154 IU/L (ref 121–224)
LDL Chol Calc (NIH): 142 mg/dL — ABNORMAL HIGH (ref 0–99)
Lymphocytes Absolute: 1.5 x10E3/uL (ref 0.7–3.1)
Lymphs: 25 %
MCH: 28.6 pg (ref 26.6–33.0)
MCHC: 33.3 g/dL (ref 31.5–35.7)
MCV: 86 fL (ref 79–97)
Monocytes Absolute: 0.4 x10E3/uL (ref 0.1–0.9)
Monocytes: 7 %
Neutrophils Absolute: 4.1 x10E3/uL (ref 1.4–7.0)
Neutrophils: 64 %
Phosphorus: 3.1 mg/dL (ref 2.8–4.1)
Platelets: 291 x10E3/uL (ref 150–450)
Potassium: 4.2 mmol/L (ref 3.5–5.2)
RBC: 5.67 x10E6/uL (ref 4.14–5.80)
RDW: 12.5 % (ref 11.6–15.4)
Sodium: 143 mmol/L (ref 134–144)
T3 Uptake Ratio: 27 % (ref 24–39)
T4, Total: 10 ug/dL (ref 4.5–12.0)
TSH: 1.4 u[IU]/mL (ref 0.450–4.500)
Total Protein: 7.7 g/dL (ref 6.0–8.5)
Triglycerides: 110 mg/dL (ref 0–149)
Uric Acid: 4.4 mg/dL (ref 3.8–8.4)
VLDL Cholesterol Cal: 20 mg/dL (ref 5–40)
WBC: 6.3 x10E3/uL (ref 3.4–10.8)
eGFR: 93 mL/min/1.73

## 2021-03-19 ENCOUNTER — Ambulatory Visit: Payer: Self-pay | Admitting: Adult Health

## 2021-03-19 ENCOUNTER — Encounter: Payer: Self-pay | Admitting: Adult Health

## 2021-03-19 ENCOUNTER — Other Ambulatory Visit: Payer: Self-pay

## 2021-03-19 VITALS — BP 126/82 | HR 71 | Temp 97.6°F | Resp 14 | Ht 71.0 in | Wt 165.0 lb

## 2021-03-19 DIAGNOSIS — E7800 Pure hypercholesterolemia, unspecified: Secondary | ICD-10-CM

## 2021-03-19 DIAGNOSIS — Z0000 Encounter for general adult medical examination without abnormal findings: Secondary | ICD-10-CM

## 2021-03-19 NOTE — Progress Notes (Signed)
Pt presents today to Complete physical with Randy Brewer

## 2021-03-19 NOTE — Progress Notes (Signed)
[facility] [address] [place] [address], [place] [address]  Internal MEDICINE  Office Visit Note  Patient Name: Randy Brewer  [id number]  [id number]  Date of Service: 03/19/2021  Chief Complaint  Patient presents with  . Annual Exam    Pt denies any issues or concerns at this time.     HPI Pt is here for routine health maintenance examination.  He is a 80 year veteran of the [employer].  He is married (5 years) and has one child from a previous marriage age 61 (son) and a two year old daughter.  His wife works for a [employer]. He denies any significant medical/sugical history. He does not take any medications daily. He denies tobacco or illicit drug use, he does use alcohol minimally.  Less than 5 drinks on an average week.   Current Medication: Outpatient Encounter Medications as of 03/19/2021  Medication Sig  . [DISCONTINUED] hydrOXYzine (ATARAX/VISTARIL) 50 MG tablet Take 1 tablet (50 mg total) by mouth 3 (three) times daily as needed for itching.  . [DISCONTINUED] methylPREDNISolone (MEDROL DOSEPAK) 4 MG TBPK tablet Take Tapered dose as directed   No facility-administered encounter medications on file as of 03/19/2021.    Surgical History: History reviewed. No pertinent surgical history.  Medical History: Past Medical History:  Diagnosis Date  . Cerumen impaction   . Elevated LDL cholesterol level   . Ganglion cyst     Family History: History reviewed. No pertinent family history.  Social History: Social History   Socioeconomic History  . Marital status: Married    Spouse name: Not on file  . Number of children: Not on file  . Years of education: Not on file  . Highest education level: Not on file  Occupational History  . Not on file  Tobacco Use  . Smoking status: Never Smoker  . Smokeless tobacco: Never Used  Substance and Sexual Activity  . Alcohol use: Yes  . Drug use: Never  . Sexual activity: Not on  file  Other Topics Concern  . Not on file  Social History Narrative  . Not on file   Social Determinants of Health   Financial Resource Strain: Not on file  Food Insecurity: Not on file  Transportation Needs: Not on file  Physical Activity: Not on file  Stress: Not on file  Social Connections: Not on file      Review of Systems  Constitutional: Negative for activity change, appetite change and fatigue.  HENT: Negative for congestion, sinus pain, trouble swallowing and voice change.   Eyes: Negative for pain, discharge and visual disturbance.  Respiratory: Negative for cough, chest tightness and shortness of breath.   Cardiovascular: Negative for chest pain and leg swelling.  Gastrointestinal: Negative for abdominal distention, abdominal pain, constipation and diarrhea.  Endocrine: Negative for cold intolerance and heat intolerance.  Genitourinary: Negative for difficulty urinating, flank pain, penile pain and testicular pain.  Musculoskeletal: Negative for arthralgias, back pain and neck pain.  Skin: Negative for color change.  Allergic/Immunologic: Negative for food allergies.  Neurological: Negative for dizziness, weakness and headaches.  Hematological: Negative for adenopathy.  Psychiatric/Behavioral: Negative for agitation, confusion and suicidal ideas.     Vital Signs: BP 126/82   Pulse 71   Temp 97.6 F (36.4 C)   Resp 14   Ht '5\' 11"'  (1.803 m)   Wt 165 lb (74.8 kg)   SpO2 100%   BMI 23.01 kg/m    Physical Exam  Constitutional:      Appearance: Normal appearance.  HENT:     Head: Normocephalic.     Right Ear: Tympanic membrane normal.     Left Ear: Tympanic membrane normal.     Nose: Nose normal.     Mouth/Throat:     Mouth: Mucous membranes are moist.     Pharynx: No oropharyngeal exudate or posterior oropharyngeal erythema.  Eyes:     General:        Right eye: No discharge.        Left eye: No discharge.     Extraocular Movements: Extraocular  movements intact.     Pupils: Pupils are equal, round, and reactive to light.  Cardiovascular:     Rate and Rhythm: Normal rate and regular rhythm.     Pulses: Normal pulses.     Heart sounds: Normal heart sounds. No murmur heard.   Pulmonary:     Effort: Pulmonary effort is normal. No respiratory distress.     Breath sounds: Normal breath sounds. No wheezing or rhonchi.  Abdominal:     General: Abdomen is flat. Bowel sounds are normal. There is no distension.     Palpations: There is no mass.     Tenderness: There is no abdominal tenderness. There is no guarding.     Hernia: No hernia is present. There is no hernia in the left inguinal area or right inguinal area.  Genitourinary:    Penis: Circumcised. No phimosis, hypospadias, erythema, tenderness, discharge or lesions.      Testes:        Right: Mass, tenderness or swelling not present.        Left: Mass, tenderness or swelling not present.     Epididymis:     Right: Normal.     Left: Normal.  Musculoskeletal:        General: No swelling or deformity. Normal range of motion.     Cervical back: Normal range of motion.  Lymphadenopathy:     Lower Body: No right inguinal adenopathy. No left inguinal adenopathy.  Skin:    General: Skin is warm and dry.     Capillary Refill: Capillary refill takes less than 2 seconds.  Neurological:     General: No focal deficit present.     Mental Status: He is alert.     Cranial Nerves: No cranial nerve deficit.     Gait: Gait normal.  Psychiatric:        Mood and Affect: Mood normal.        Behavior: Behavior normal.        Judgment: Judgment normal.      LABS: Recent Results (from the past 2160 hour(s))  CMP12+LP+TP+TSH+6AC+CBC/D/Plt     Status: Abnormal   Collection Time: 03/13/21  9:15 AM  Result Value Ref Range   Glucose 89 65 - 99 mg/dL   Uric Acid 4.4 3.8 - 8.4 mg/dL    Comment:            Therapeutic target for gout patients: <6.0   BUN 15 6 - 20 mg/dL   Creatinine, Ser  1.06 0.76 - 1.27 mg/dL   eGFR 93 >59 mL/min/1.73   BUN/Creatinine Ratio 14 9 - 20   Sodium 143 134 - 144 mmol/L   Potassium 4.2 3.5 - 5.2 mmol/L   Chloride 99 96 - 106 mmol/L   Calcium 9.5 8.7 - 10.2 mg/dL   Phosphorus 3.1 2.8 - 4.1 mg/dL   Total Protein 7.7 6.0 -  8.5 g/dL   Albumin 5.1 (H) 4.0 - 5.0 g/dL   Globulin, Total 2.6 1.5 - 4.5 g/dL   Albumin/Globulin Ratio 2.0 1.2 - 2.2   Bilirubin Total 0.7 0.0 - 1.2 mg/dL   Alkaline Phosphatase 69 44 - 121 IU/L   LDH 154 121 - 224 IU/L   AST 18 0 - 40 IU/L   ALT 21 0 - 44 IU/L   GGT 22 0 - 65 IU/L   Iron 109 38 - 169 ug/dL   Cholesterol, Total 214 (H) 100 - 199 mg/dL   Triglycerides 110 0 - 149 mg/dL   HDL 52 >39 mg/dL   VLDL Cholesterol Cal 20 5 - 40 mg/dL   LDL Chol Calc (NIH) 142 (H) 0 - 99 mg/dL   Chol/HDL Ratio 4.1 0.0 - 5.0 ratio    Comment:                                   T. Chol/HDL Ratio                                             Men  Women                               1/2 Avg.Risk  3.4    3.3                                   Avg.Risk  5.0    4.4                                2X Avg.Risk  9.6    7.1                                3X Avg.Risk 23.4   11.0    Estimated CHD Risk 0.8 0.0 - 1.0 times avg.    Comment: The CHD Risk is based on the T. Chol/HDL ratio. Other factors affect CHD Risk such as hypertension, smoking, diabetes, severe obesity, and family history of premature CHD.    TSH 1.400 0.450 - 4.500 uIU/mL   T4, Total 10.0 4.5 - 12.0 ug/dL   T3 Uptake Ratio 27 24 - 39 %   Free Thyroxine Index 2.7 1.2 - 4.9   WBC 6.3 3.4 - 10.8 x10E3/uL   RBC 5.67 4.14 - 5.80 x10E6/uL   Hemoglobin 16.2 13.0 - 17.7 g/dL   Hematocrit 48.6 37.5 - 51.0 %   MCV 86 79 - 97 fL   MCH 28.6 26.6 - 33.0 pg   MCHC 33.3 31.5 - 35.7 g/dL   RDW 12.5 11.6 - 15.4 %   Platelets 291 150 - 450 x10E3/uL   Neutrophils 64 Not Estab. %   Lymphs 25 Not Estab. %   Monocytes 7 Not Estab. %   Eos 3 Not Estab. %   Basos 1 Not Estab. %    Neutrophils Absolute 4.1 1.4 - 7.0 x10E3/uL   Lymphocytes Absolute 1.5 0.7 - 3.1 x10E3/uL   Monocytes Absolute 0.4 0.1 - 0.9 x10E3/uL  EOS (ABSOLUTE) 0.2 0.0 - 0.4 x10E3/uL   Basophils Absolute 0.1 0.0 - 0.2 x10E3/uL   Immature Granulocytes 0 Not Estab. %   Immature Grans (Abs) 0.0 0.0 - 0.1 x10E3/uL  POCT urinalysis dipstick     Status: Normal   Collection Time: 03/13/21  9:41 AM  Result Value Ref Range   Color, UA yellow    Clarity, UA clear    Glucose, UA Negative Negative   Bilirubin, UA negative    Ketones, UA negative    Spec Grav, UA 1.025 1.010 - 1.025   Blood, UA negative    pH, UA 6.0 5.0 - 8.0   Protein, UA Negative Negative   Urobilinogen, UA 0.2 0.2 or 1.0 E.U./dL   Nitrite, UA negative    Leukocytes, UA Negative Negative   Appearance medium    Odor       Assessment/Plan: 1. Annual physical exam Up to date on PHM. Schedule physical and repeat labs in one year.   2. Elevated LDL cholesterol level Patient admits that diet needs improvement.  He will increase exercise level and begin to evaluate dietary options.        General Counseling: anshul meddings understanding of the findings of todays visit and agrees with plan of treatment. I have discussed any further diagnostic evaluation that may be needed or ordered today. We also reviewed his medications today. he has been encouraged to call the office with any questions or concerns that should arise related to todays visit.    No orders of the defined types were placed in this encounter.   No orders of the defined types were placed in this encounter.   Total time spent: 40 Minutes  Time spent includes review of chart, medications, test results, and follow up plan with the patient.    Kendell Bane AGNP-C Nurse Practitioner

## 2021-04-01 ENCOUNTER — Telehealth: Payer: Self-pay

## 2021-04-01 NOTE — Telephone Encounter (Signed)
Randy Brewer called requesting to come into clinic today.  C/O sinus congestion & waking up this morning with reddened left eye.  S/Sx started Friday with bodyaches & fatigue that have now resolved.  Rapid Covid test done over the weekend that was negative.  Also, states past two nights throat feels swollen.  Denies H/A; facial pain/pressure; cough, ear discomfort & fever.    States he's been using OTC products such as Dayquil & fluticasone 1x.  Reviewed above info with AJuanda Bond, NP-C ([facility] Interim Provider) in the clinic today.  Fluticasone 2 sprays each nostril bid Antihistamine such as Clariin, Allegra or Zyrtec. Sudafed/Phenylephrine as directed on package  Take the above meds for the next couple of days.  Call us back if S/Sx worsen.  Called Chris & reviewed provider's instructions.  Verbalized understanding.  Came to the clinic to pick up OTC Claritin & Phenylephrine.  AMD

## 2021-04-03 ENCOUNTER — Ambulatory Visit
Admission: EM | Admit: 2021-04-03 | Discharge: 2021-04-03 | Disposition: A | Discharge status: Home / Self Care | Payer: 59 | Attending: Emergency Medicine | Admitting: Emergency Medicine

## 2021-04-03 ENCOUNTER — Encounter: Payer: Self-pay | Admitting: Emergency Medicine

## 2021-04-03 ENCOUNTER — Other Ambulatory Visit: Payer: Self-pay

## 2021-04-03 DIAGNOSIS — J069 Acute upper respiratory infection, unspecified: Secondary | ICD-10-CM | POA: Diagnosis not present

## 2021-04-03 MED ORDER — IPRATROPIUM BROMIDE 0.06 % NA SOLN: 2.0000 | Freq: Four times a day (QID) | NASAL | 12 refills | Status: DC

## 2021-04-03 NOTE — ED Triage Notes (Signed)
Patient states that he had a cold that started last Friday.  Patient states that he still has nasal congestion and a sore throat.  Patient denies fevers. Patient took home covid test last weekend and was negative.

## 2021-04-03 NOTE — ED Provider Notes (Signed)
Once that he [facility]    CSN: [id number] Arrival date & time: 04/03/21  1158      History   Chief Complaint Chief Complaint  Patient presents with   Nasal Congestion   Sore Throat    HPI Randy Brewer is a 35 y.o. male.   HPI  35 year old male here for evaluation of nasal congestion and sore throat.  Been experiencing nasal congestion for the past week and a sore throat for last 3 to 4 days.  He states this is much pain as he feels like his throat is swollen and the swelling increases at nighttime.  He has also had a runny nose and postnasal drip.  He denies fever, cough, ear pain or pressure, GI complaints, body aches, or sick contacts.  Past Medical History:  Diagnosis Date   Cerumen impaction    Elevated LDL cholesterol level    Ganglion cyst     There are no problems to display for this patient.   History reviewed. No pertinent surgical history.     Home Medications    Prior to Admission medications   Medication Sig Start Date End Date Taking? Authorizing Provider  ipratropium (ATROVENT) 0.06 % nasal spray Place 2 sprays into both nostrils 4 (four) times daily. 04/03/21  Yes Becky Augusta, NP    Family History History reviewed. No pertinent family history.  Social History Social History   Tobacco Use   Smoking status: Never   Smokeless tobacco: Never  Vaping Use   Vaping Use: Never used  Substance Use Topics   Alcohol use: Yes   Drug use: Never     Allergies   Patient has no known allergies.   Review of Systems Review of Systems  Constitutional:  Negative for activity change, appetite change and fever.  HENT:  Positive for congestion, postnasal drip, rhinorrhea and sore throat.   Respiratory:  Positive for cough. Negative for shortness of breath and wheezing.   Gastrointestinal:  Negative for diarrhea, nausea and vomiting.  Musculoskeletal:  Negative for arthralgias and myalgias.  Skin:  Negative for rash.   Hematological: Negative.   Psychiatric/Behavioral: Negative.      Physical Exam Triage Vital Signs ED Triage Vitals  Enc Vitals Group     BP 04/03/21 1249 124/82     Pulse Rate 04/03/21 1249 62     Resp 04/03/21 1249 16     Temp 04/03/21 1249 98.2 F (36.8 C)     Temp Source 04/03/21 1249 Oral     SpO2 04/03/21 1249 99 %     Weight 04/03/21 1246 165 lb (74.8 kg)     Height 04/03/21 1246 5\' 11"  (1.803 m)     Head Circumference --      Peak Flow --      Pain Score 04/03/21 1246 2     Pain Loc --      Pain Edu? --      Excl. in GC? --    No data found.  Updated Vital Signs BP 124/82 (BP Location: Right Arm)   Pulse 62   Temp 98.2 F (36.8 C) (Oral)   Resp 16   Ht 5\' 11"  (1.803 m)   Wt 165 lb (74.8 kg)   SpO2 99%   BMI 23.01 kg/m   Visual Acuity Right Eye Distance:   Left Eye Distance:   Bilateral Distance:    Right Eye Near:   Left Eye Near:    Bilateral Near:  Physical Exam Vitals and nursing note reviewed.  Constitutional:      General: He is not in acute distress.    Appearance: Normal appearance. He is well-developed and normal weight. He is not ill-appearing.  HENT:     Head: Normocephalic and atraumatic.     Right Ear: Tympanic membrane, ear canal and external ear normal. There is no impacted cerumen.     Left Ear: Tympanic membrane, ear canal and external ear normal. There is no impacted cerumen.     Nose: Congestion and rhinorrhea present.     Mouth/Throat:     Mouth: Mucous membranes are moist.     Pharynx: Oropharynx is clear. Posterior oropharyngeal erythema present.  Cardiovascular:     Rate and Rhythm: Normal rate and regular rhythm.     Pulses: Normal pulses.     Heart sounds: Normal heart sounds. No murmur heard.   No gallop.  Pulmonary:     Effort: Pulmonary effort is normal.     Breath sounds: Normal breath sounds. No wheezing, rhonchi or rales.  Musculoskeletal:     Cervical back: Normal range of motion and neck supple.   Lymphadenopathy:     Cervical: No cervical adenopathy.  Skin:    General: Skin is warm and dry.     Capillary Refill: Capillary refill takes less than 2 seconds.     Findings: No erythema or rash.  Neurological:     General: No focal deficit present.     Mental Status: He is alert and oriented to person, place, and time.  Psychiatric:        Mood and Affect: Mood normal.        Behavior: Behavior normal.        Thought Content: Thought content normal.        Judgment: Judgment normal.     UC Treatments / Results  Labs (all labs ordered are listed, but only abnormal results are displayed) Labs Reviewed - No data to display  EKG   Radiology No results found.  Procedures Procedures (including critical care time)  Medications Ordered in UC Medications - No data to display  Initial Impression / Assessment and Plan / UC Course  I have reviewed the triage vital signs and the nursing notes.  Pertinent labs & imaging results that were available during my care of the patient were reviewed by me and considered in my medical decision making (see chart for details).  Very pleasant, nontoxic-appearing 35 year old male here for evaluation of respiratory complaints that been going on for the past week as outlined in HPI above.  Patient's physical exam reveals pearly gray tympanic membranes bilaterally with a normal light reflex infection auditory canals.  Nasal mucosa is erythematous and edematous with clear nasal discharge.  The swelling of the left nare is more profound than that of the right.  Oropharyngeal exam reveals recessed tonsillar pillars without edema, erythema, or exudate.  Posterior oropharynx does have erythema and cobblestoning with clear postnasal drip.  No cervical lymphadenopathy appreciated exam.  Cardiopulmonary exam is benign.  Patient's exam is consistent with a URI and will treat with Atrovent nasal spray to help with nasal congestion and postnasal drip symptoms.   Patient structured to return for new or worsening symptoms.   Final Clinical Impressions(s) / UC Diagnoses   Final diagnoses:  Upper respiratory tract infection, unspecified type     Discharge Instructions      Use the Atrovent nasal spray, 2 squirts in each nostril every  6 hours, as needed for runny nose and postnasal drip.  Drink warm or cold fluids, your preference, to help soothe your throat pain and the feeling of swelling.  You can also gargle with warm salt water 2-3 times a day to help wash away drainage and decrease the sensation of swelling and discomfort.  Return for reevaluation or see your primary care provider for any new or worsening symptoms.      ED Prescriptions     Medication Sig Dispense Auth. Provider   ipratropium (ATROVENT) 0.06 % nasal spray Place 2 sprays into both nostrils 4 (four) times daily. 15 mL Becky Augusta, NP      PDMP not reviewed this encounter.   Becky Augusta, NP 04/03/21 1316

## 2021-04-03 NOTE — Discharge Instructions (Addendum)
Use the Atrovent nasal spray, 2 squirts in each nostril every 6 hours, as needed for runny nose and postnasal drip.  Drink warm or cold fluids, your preference, to help soothe your throat pain and the feeling of swelling.  You can also gargle with warm salt water 2-3 times a day to help wash away drainage and decrease the sensation of swelling and discomfort.  Return for reevaluation or see your primary care provider for any new or worsening symptoms.

## 2021-05-03 ENCOUNTER — Ambulatory Visit: Payer: 59

## 2021-05-04 ENCOUNTER — Ambulatory Visit: Payer: Self-pay | Admitting: Physician Assistant

## 2021-05-04 ENCOUNTER — Encounter: Payer: Self-pay | Admitting: Physician Assistant

## 2021-05-04 DIAGNOSIS — S46911A Strain of unspecified muscle, fascia and tendon at shoulder and upper arm level, right arm, initial encounter: Secondary | ICD-10-CM

## 2021-05-04 MED ORDER — ORPHENADRINE CITRATE ER 100 MG PO TB12: 100.0000 mg | ORAL_TABLET | Freq: Two times a day (BID) | ORAL | 0 refills | Status: DC

## 2021-05-04 MED ORDER — NAPROXEN 500 MG PO TABS: 500.0000 mg | ORAL_TABLET | Freq: Two times a day (BID) | ORAL | Status: DC

## 2021-05-04 NOTE — Progress Notes (Signed)
   Subjective: Patient presents for work-related upper back injury.    Patient ID: Randy Brewer, male    DOB: Dec 03, 1985, 35 y.o.   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  Patient the pain is localized now to the right inferior scapular area.  Patient describes the pain as "achy ".  Rates pain as a 6/10.  No palliative measure for complaint.  Patient is right-hand dominant.   Review of Systems Negative except for complaint.    Objective:   Physical Exam No acute distress.  BP is 116/78, pulse 60, patient 100% O2 sat on room air, temperature 97.6, respiration 12, patient with 165 pounds.  No obvious deformity to the cervical or thoracic spine.  Patient has full and equal range of motion of the cervical spine.  Patient has moderate guarding palpation of the right lateral inferior scapular area.  Patient has full Nikkel range of motion of the upper extremities.  Decreased strength against resistance of the right upper extremity.  No ecchymosis, edema, or erythema.     Assessment & Plan: Right scapular muscle strain.   Patient had a Lidoderm patch applied in clinic.  Patient given a prescription for Norflex and naproxen.  Patient placed on restriction for 3 days and will follow-up in 3 days.

## 2021-05-06 ENCOUNTER — Encounter: Payer: Self-pay | Admitting: Physician Assistant

## 2021-05-06 ENCOUNTER — Ambulatory Visit: Payer: Self-pay | Admitting: Physician Assistant

## 2021-05-06 ENCOUNTER — Other Ambulatory Visit: Payer: Self-pay

## 2021-05-06 VITALS — BP 107/76 | HR 65 | Temp 97.7°F | Resp 14 | Ht 71.0 in | Wt 165.0 lb

## 2021-05-06 DIAGNOSIS — S46911A Strain of unspecified muscle, fascia and tendon at shoulder and upper arm level, right arm, initial encounter: Secondary | ICD-10-CM

## 2021-05-06 NOTE — Progress Notes (Signed)
   Subjective: Right scapular muscle strain    Patient ID: Randy Brewer, male    DOB: 14-Oct-1986, 35 y.o.   MRN: [id number]  HPI Patient presents for reevaluation of right scapular muscle sprain strain which occurred 4 days ago.  Patient stated decreased pain and decreased range of motion status post muscle relaxants and NSAIDs.   Review of Systems     Objective:   Physical Exam  No acute distress.  Temperature 97.7, pulse 65, respiration 14, BP is 107/76, patient 100% O2 sat on room air.   No obvious deformity to the right scapular area.  No guarding with palpation.  Patient full Nikkel range of motion.  Strength against resistance 5/5.    Assessment & Plan: Right scapular strain   Patient is made remarkable improvement past 2 days.  Based on physical exam patient is able to return back to full duties.  Follow-up as necessary.

## 2021-05-11 ENCOUNTER — Other Ambulatory Visit: Payer: Self-pay

## 2021-05-11 ENCOUNTER — Encounter: Payer: Self-pay | Admitting: Physician Assistant

## 2021-05-11 ENCOUNTER — Ambulatory Visit: Payer: Self-pay | Admitting: Physician Assistant

## 2021-05-11 VITALS — BP 118/80 | HR 67 | Temp 97.6°F | Resp 12 | Ht 71.0 in | Wt 165.0 lb

## 2021-05-11 DIAGNOSIS — S46911A Strain of unspecified muscle, fascia and tendon at shoulder and upper arm level, right arm, initial encounter: Secondary | ICD-10-CM

## 2021-05-11 NOTE — Progress Notes (Signed)
   Subjective: Right scapular area strain    Patient ID: Randy Brewer, male    DOB: 10-25-1985, 35 y.o.   MRN: [id number]  HPI Patient continues have right scapular pain with exertion.  Patient states he has full full and equal range of motion but certain activities exacerbate discomfort in the inferior right scapular area.  Patient state mild transient relief over-the-counter medications but is concerned that he has not reached maximal medical improvement.  Review of Systems Negative except for complaint    Objective:   Physical Exam No acute distress.  No obvious deformity.  Patient has mild guarding with palpation of the lateral right scapular area inferior area.  Patient has full and equal range of motion.  Strength is 4/5 with resistance.       Assessment & Plan: Right scapular strain.   Advised patient continue over-the-counter anti-inflammatory medication pending consult for physical therapy for further evaluation and treatment.

## 2021-05-11 NOTE — Addendum Note (Signed)
Addended by: Gardner Candle on: 05/11/2021 02:40 PM   Modules accepted: Orders

## 2021-05-11 NOTE — Progress Notes (Signed)
Pt states if he jogs, running or any activity that involves the muscle that is strained it is painful.

## 2021-05-18 DIAGNOSIS — M546 Pain in thoracic spine: Secondary | ICD-10-CM | POA: Insufficient documentation

## 2021-12-17 ENCOUNTER — Other Ambulatory Visit: Payer: Self-pay

## 2021-12-17 DIAGNOSIS — J020 Streptococcal pharyngitis: Secondary | ICD-10-CM

## 2021-12-17 DIAGNOSIS — Z1152 Encounter for screening for COVID-19: Secondary | ICD-10-CM

## 2021-12-17 LAB — POCT RAPID STREP A (OFFICE): Rapid Strep A Screen: NEGATIVE

## 2021-12-17 LAB — POC COVID19 BINAXNOW: SARS Coronavirus 2 Ag: NEGATIVE

## 2021-12-17 NOTE — Progress Notes (Signed)
Pt presents today for strept and covid rapid test. Congestion, cough, swollen/red throat x 1 week. ?

## 2021-12-25 ENCOUNTER — Other Ambulatory Visit: Payer: Self-pay

## 2021-12-25 ENCOUNTER — Ambulatory Visit: Payer: Self-pay

## 2021-12-25 DIAGNOSIS — Z0183 Encounter for blood typing: Secondary | ICD-10-CM

## 2021-12-25 DIAGNOSIS — Z0000 Encounter for general adult medical examination without abnormal findings: Secondary | ICD-10-CM

## 2021-12-25 LAB — POCT URINALYSIS DIPSTICK
Bilirubin, UA: NEGATIVE
Blood, UA: NEGATIVE
Glucose, UA: NEGATIVE
Ketones, UA: NEGATIVE
Leukocytes, UA: NEGATIVE
Nitrite, UA: NEGATIVE
Protein, UA: NEGATIVE
Spec Grav, UA: 1.02 (ref 1.010–1.025)
Urobilinogen, UA: 0.2 E.U./dL
pH, UA: 6 (ref 5.0–8.0)

## 2021-12-25 NOTE — Progress Notes (Signed)
12/31/21 annual physical ?

## 2021-12-26 LAB — CMP12+LP+TP+TSH+6AC+CBC/D/PLT
ALT: 25 IU/L (ref 0–44)
AST: 22 IU/L (ref 0–40)
Albumin/Globulin Ratio: 1.8 (ref 1.2–2.2)
Albumin: 5 g/dL (ref 4.0–5.0)
Alkaline Phosphatase: 88 IU/L (ref 44–121)
BUN/Creatinine Ratio: 15 (ref 9–20)
BUN: 17 mg/dL (ref 6–20)
Basophils Absolute: 0.1 10*3/uL (ref 0.0–0.2)
Basos: 1 %
Bilirubin Total: 0.5 mg/dL (ref 0.0–1.2)
Calcium: 9.6 mg/dL (ref 8.7–10.2)
Chloride: 97 mmol/L (ref 96–106)
Chol/HDL Ratio: 3.6 ratio (ref 0.0–5.0)
Cholesterol, Total: 210 mg/dL — ABNORMAL HIGH (ref 100–199)
Creatinine, Ser: 1.13 mg/dL (ref 0.76–1.27)
EOS (ABSOLUTE): 0.1 10*3/uL (ref 0.0–0.4)
Eos: 1 %
Estimated CHD Risk: 0.6 times avg. (ref 0.0–1.0)
Free Thyroxine Index: 2.6 (ref 1.2–4.9)
GGT: 30 IU/L (ref 0–65)
Globulin, Total: 2.8 g/dL (ref 1.5–4.5)
Glucose: 73 mg/dL (ref 70–99)
HDL: 58 mg/dL (ref >39)
Hematocrit: 49.6 % (ref 37.5–51.0)
Hemoglobin: 16.6 g/dL (ref 13.0–17.7)
Immature Grans (Abs): 0 10*3/uL (ref 0.0–0.1)
Immature Granulocytes: 0 %
Iron: 77 ug/dL (ref 38–169)
LDH: 147 IU/L (ref 121–224)
LDL Chol Calc (NIH): 133 mg/dL — ABNORMAL HIGH (ref 0–99)
Lymphocytes Absolute: 1.3 10*3/uL (ref 0.7–3.1)
Lymphs: 17 %
MCH: 28.4 pg (ref 26.6–33.0)
MCHC: 33.5 g/dL (ref 31.5–35.7)
MCV: 85 fL (ref 79–97)
Monocytes Absolute: 0.5 10*3/uL (ref 0.1–0.9)
Monocytes: 6 %
Neutrophils Absolute: 5.7 10*3/uL (ref 1.4–7.0)
Neutrophils: 75 %
Phosphorus: 3.3 mg/dL (ref 2.8–4.1)
Platelets: 352 10*3/uL (ref 150–450)
Potassium: 4.1 mmol/L (ref 3.5–5.2)
RBC: 5.85 x10E6/uL — ABNORMAL HIGH (ref 4.14–5.80)
RDW: 12.5 % (ref 11.6–15.4)
Sodium: 138 mmol/L (ref 134–144)
T3 Uptake Ratio: 26 % (ref 24–39)
T4, Total: 10 ug/dL (ref 4.5–12.0)
TSH: 1.88 u[IU]/mL (ref 0.450–4.500)
Total Protein: 7.8 g/dL (ref 6.0–8.5)
Triglycerides: 105 mg/dL (ref 0–149)
Uric Acid: 4.8 mg/dL (ref 3.8–8.4)
VLDL Cholesterol Cal: 19 mg/dL (ref 5–40)
WBC: 7.6 10*3/uL (ref 3.4–10.8)
eGFR: 86 mL/min/{1.73_m2} (ref >59)

## 2021-12-26 LAB — ABO AND RH: Rh Factor: POSITIVE

## 2021-12-31 ENCOUNTER — Encounter: Payer: Self-pay | Admitting: Physician Assistant

## 2021-12-31 ENCOUNTER — Ambulatory Visit: Payer: Self-pay | Admitting: Physician Assistant

## 2021-12-31 ENCOUNTER — Other Ambulatory Visit: Payer: Self-pay

## 2021-12-31 VITALS — BP 109/69 | HR 62 | Temp 97.8°F | Resp 12 | Ht 71.0 in | Wt 165.0 lb

## 2021-12-31 MED ORDER — FEXOFENADINE-PSEUDOEPHED ER 60-120 MG PO TB12: 1.0000 | ORAL_TABLET | Freq: Two times a day (BID) | ORAL | 0 refills | Status: DC

## 2021-12-31 NOTE — Progress Notes (Signed)
? ?[facility] [place] [place] [facility] ?____________________________________________ ? ? None  ?  (approximate) ? ?I have reviewed the triage vital signs and the nursing notes. ? ? ?HISTORY ? ?Chief Complaint ?Annual Exam ? ? ?HPI ?Randy Brewer is a 35 y.o. male patient presents for annual physical exam.  Patient is most concerned of right ear fullness and numbness to fingertips.  Patient states right ear pain/pressure secondary to URI 2 weeks ago.  Patient relates he does have a history of cerumen impaction.  Regarding the numbness to the fingertips patient stated has been ongoing problem for many years.  It seems to be weather related is worse in the cool to cold temperature range.  Denies loss of function of the fingers. ?   ? ?  ? ? ?Past Medical History:  ?Diagnosis Date  ? Cerumen impaction   ? Elevated LDL cholesterol level   ? Ganglion cyst   ? ? ?There are no problems to display for this patient. ? ? ?History reviewed. No pertinent surgical history. ? ?Prior to Admission medications   ?Not on File  ? ? ?Allergies ?Patient has no known allergies. ? ?History reviewed. No pertinent family history. ? ?Social History ?Social History  ? ?Tobacco Use  ? Smoking status: Never  ? Smokeless tobacco: Never  ?Vaping Use  ? Vaping Use: Never used  ?Substance Use Topics  ? Alcohol use: Yes  ? Drug use: Never  ? ? ?Review of Systems ?Constitutional: No fever/chills ?Eyes: No visual changes. ?ENT: No sore throat. ?Cardiovascular: Denies chest pain. ?Respiratory: Denies shortness of breath. ?Gastrointestinal: No abdominal pain.  No nausea, no vomiting.  No diarrhea.  No constipation. ?Genitourinary: Negative for dysuria. ?Musculoskeletal: Negative for back pain. ?Skin: Negative for rash. ?Neurological: Bilateral numbness to fingers sparing the thumbs. ? ?____________________________________________ ? ? ?PHYSICAL EXAM: ? ?VITAL SIGNS: Temperature is 97.8, respiration 12, pulse 74, BP is 118/80, and patient 99% O2 sat on room  air.  Patient with 165 pounds and BMI is 23.01. ?Constitutional: Alert and oriented. Well appearing and in no acute distress. ?Eyes: Conjunctivae are normal. PERRL. EOMI. ?Head: Atraumatic. ?Nose: No congestion/rhinnorhea. ?Mouth/Throat: Mucous membranes are moist.  Oropharynx non-erythematous. ?EARS: Right ear canal with cerumen impaction.  Left canal clear. ?Neck: No stridor.  No cervical spine tenderness to palpation. ?Hematological/Lymphatic/Immunilogical: No cervical lymphadenopathy. ?Cardiovascular: Normal rate, regular rhythm. Grossly normal heart sounds.  Good peripheral circulation. ?Respiratory: Normal respiratory effort.  No retractions. Lungs CTAB. ?Gastrointestinal: Soft and nontender. No distention. No abdominal bruits. No CVA tenderness. ?Genitourinary: Deferred ?Musculoskeletal: No lower extremity tenderness nor edema.  No joint effusions. ?Neurologic:  Normal speech and language. No gross focal neurologic deficits are appreciated. No gait instability. ?Skin:  Skin is warm, dry and intact. No rash noted.  Initial demarcation of skin of bilateral distal second through fifth phalanges. ?Psychiatric: Mood and affect are normal. Speech and behavior are normal. ? ?____________________________________________ ?  ?LABS ? ?_____ ?      ?Component Ref Range & Units 6 d ago 9 mo ago 1 yr ago  ?Color, UA  medium yellow  yellow  yellow   ?Clarity, UA  clear  clear  cloudy   ?Glucose, UA Negative Negative  Negative  Negative   ?Bilirubin, UA  negative  negative  negative   ?Ketones, UA  negative  negative  negative   ?Spec Grav, UA 1.010 - 1.025 1.020  1.025  1.020   ?Blood, UA  negative  negative  negative   ?pH, UA   5.0 - 8.0 6.0  6.0  6.5   ?Protein, UA Negative Negative  Negative  Negative   ?Urobilinogen, UA 0.2 or 1.0 E.U./dL 0.2  0.2  0.2   ?Nitrite, UA  negative  negative  negative   ?Leukocytes, UA Negative Negative  Negative  Negative   ?Appearance   medium     ?Odor        ?  ? ?  ?  ?Specimen  Collected: 12/25/21 09:35 Last Resulted: 12/25/21 09:35  ?  ?  Lab Flowsheet   ? Order Details   ? View Encounter   ? Lab and Collection Details   ? Routing   ? Result History    ?View Encounter Conversation    ?  ?  ?Result Care Coordination ? ? ?Patient Communication ? ? Add Comments   Seen Back to Top  ?  ?  ? ?Other Results from 12/25/2021 ? ? Contains abnormal data CMP12+LP+TP+TSH+6AC+CBC/D/Plt ?Order: [id number] ?Status: Final result    ?Visible to patient: Yes (seen)    ?Next appt: None    ?Dx: Routine adult health maintenance    ?0 Result Notes ?           ?Component Ref Range & Units 6 d ago ?(12/25/21) 9 mo ago ?(03/13/21) 1 yr ago ?(03/24/20) 1 yr ago ?(02/11/20) 1 yr ago ?(02/11/20) 4 yr ago ?(04/19/17) 8 yr ago ?(09/13/13) 8 yr ago ?(09/13/13)  ?Glucose 70 - 99 mg/dL 73  89 R  85 R  148 High  CM    150 High  R    ?Uric Acid 3.8 - 8.4 mg/dL 4.8  4.4 CM  4.7 CM        ?Comment:            Therapeutic target for gout patients: <6.0  ?BUN 6 - 20 mg/dL 17  15  15  23 High     21 High  R    ?Creatinine, Ser 0.76 - 1.27 mg/dL 1.13  1.06  1.06  1.17 R    1.21 R    ?eGFR >59 mL/min/1.73 86  93         ?BUN/Creatinine Ratio 9 - 20 15  14  14        ?Sodium 134 - 144 mmol/L 138  143  138  142 R    137 R    ?Potassium 3.5 - 5.2 mmol/L 4.1  4.2  4.4  3.5 R    4.0 R    ?Chloride 96 - 106 mmol/L 97  99  100  111 R    105 R    ?Calcium 8.7 - 10.2 mg/dL 9.6  9.5  9.5  8.3 Low  R    9.2 R    ?Phosphorus 2.8 - 4.1 mg/dL 3.3  3.1  3.0        ?Total Protein 6.0 - 8.5 g/dL 7.8  7.7  7.2  7.4 R    7.6 R    ?Albumin 4.0 - 5.0 g/dL 5.0  5.1 High   4.8  4.5 R    4.3 R    ?Globulin, Total 1.5 - 4.5 g/dL 2.8  2.6  2.4        ?Albumin/Globulin Ratio 1.2 - 2.2 1.8  2.0  2.0        ?Bilirubin Total 0.0 - 1.2 mg/dL 0.5  0.7  0.9  1.6 High  R    1.3 High  R    ?Alkaline Phosphatase   44 - 121 IU/L 88  69  62 R  44 R    65 R, CM    ?LDH 121 - 224 IU/L 147  154  152        ?AST 0 - 40 IU/L _0 R    17 R    ?ALT 0 - 44 IU/L _1 R    35 R    ?GGT 0 - 65 IU/L _2 ?Iron 38 - 169 ug/dL 77  109  113        ?Cholesterol, Total 100 - 199 mg/dL 210 High   214 High   212 High         ?Triglycerides 0 - 149 mg/dL 105  110  94        ?HDL >39 mg/dL 58  52  51        ?VLDL Cholesterol Cal 5 - 40 mg/dL _3 ?LDL Chol Calc (NIH) 0 - 99 mg/dL 133 High   142 High   144 High         ?Chol/HDL Ratio 0.0 - 5.0 ratio 3.6  4.1 CM  4.2 CM        ?Comment:                                   T. Chol/HDL Ratio  ?                                            Men  Women  ?                              1/2 Avg.Risk  3.4    3.3  ?                                  Avg.Risk  5.0    4.4  ?                               2X Avg.Risk  9.6    7.1  ?                               3X Avg.Risk 23.4   11.0   ?Estimated CHD Risk 0.0 - 1.0 times avg. 0.6  0.8 CM  0.8 CM        ?Comment: The CHD Risk is based on the T. Chol/HDL ratio. Other  ?factors affect CHD Risk such as hypertension, smoking,  ?diabetes, severe obesity, and family history of  ?premature CHD.   ?TSH 0.450 - 4.500 uIU/mL 1.880  1.400  1.360        ?T4, Total 4.5 - 12.0 ug/dL 10.0  10.0  8.5        ?T3 Uptake Ratio 24 - 39 % _4 ?Free Thyroxine Index 1.2 - 4.9  2.6  2.7  2.5        ?WBC 3.4 - 10.8 x10E3/uL 7.6  6.3  5.2   9.9 R  5.2 R   8.9 R   ?RBC 4.14 - 5.80 x10E6/uL 5.85 High   5.67  5.50   5.96 High  R  5.41 R   5.61 R   ?Hemoglobin 13.0 - 17.7 g/dL 16.6  16.2  15.8   16.9 R  15.4 R     ?Hematocrit 37.5 - 51.0 % 49.6  48.6  46.2   48.2 R  44.9 R     ?MCV 79 - 97 fL 85  86  84   80.9 R  83.1 R   83 R   ?MCH 26.6 - 33.0 pg 28.4  28.6  28.7   28.4 R  28.6 R   28.6 R   ?MCHC 31.5 - 35.7 g/dL 33.5  33.3  34.2   35.1 R  34.4 R   34.4 R   ?RDW 11.6 - 15.4 % 12.5  12.5  12.5   12.3 R  12.8 R   13.0 R   ?Platelets 150 - 450 x10E3/uL 352  291  305   287 R  186 R   195 R   ?Neutrophils Not Estab. % 75  64  62    72 R   93.5 R   ?Lymphs Not Estab. % 17  25  25        ?Monocytes Not  Estab. % 6  7  7        ?Eos Not Estab. % 1  3  5        ?Basos Not Estab. % 1  1  1        ?Neutrophils Absolute 1.4 - 7.0 x10E3/uL 5.7  4.1  3.3    3.7 R   8.4 High  R   ?Lymphocytes Absolute 0.7 - 3.1 x10E3/

## 2021-12-31 NOTE — Progress Notes (Signed)
Pt presents today for Physical. Pt has a few concerns he would like to discuss. Burna Sis ?

## 2022-01-01 LAB — ANA: Anti Nuclear Antibody (ANA): NEGATIVE

## 2022-01-01 LAB — C-REACTIVE PROTEIN: CRP: 1 mg/L (ref 0–10)

## 2022-01-01 LAB — SEDIMENTATION RATE: Sed Rate: 6 mm/hr (ref 0–15)

## 2022-10-22 ENCOUNTER — Other Ambulatory Visit: Payer: Self-pay

## 2022-10-22 DIAGNOSIS — J101 Influenza due to other identified influenza virus with other respiratory manifestations: Secondary | ICD-10-CM

## 2022-10-22 DIAGNOSIS — R0981 Nasal congestion: Secondary | ICD-10-CM

## 2022-10-22 LAB — POCT INFLUENZA A/B
Influenza A, POC: POSITIVE — AB
Influenza B, POC: NEGATIVE

## 2022-10-22 LAB — POC COVID19 BINAXNOW: SARS Coronavirus 2 Ag: NEGATIVE

## 2022-10-22 MED ORDER — OSELTAMIVIR PHOSPHATE 75 MG PO CAPS: 75.0000 mg | ORAL_CAPSULE | Freq: Two times a day (BID) | ORAL | 0 refills | Status: DC

## 2022-10-22 NOTE — Progress Notes (Signed)
S/Sx started yesterday: Nasal congestion Fatigue Headache Cough - sometimes productive (green-clear phlegm) Slight ear discomfort  Denies fever & N/V/D  No flu vaccine this year.  Father was Dx'd with flu last week & was around him.  Wife & daughter have been sick - home covid test on daughter was negative  Taking OTC Mucinex  Informed [name] he's positive for Influenza A.  States he would like a Rx for Tamiflu. Advised him to take Allegra D bid OK to continue using Mucinex Increase po fluids Get some rest Fever free for 24 hours w/o use of acetaminophen or ibuprofen before returning to work.  AMD

## 2022-10-22 NOTE — Telephone Encounter (Signed)
Randel Pigg, PA-C having difficulty accessing Epic from home (on-call today).  Review [name]' S/Sx & positive Influenza A test results. Advised that [name] does want Rx for Tamiflu.  States congestion is worst symptom.  Received order to send Rx in for Tamiflu.  AMD

## 2022-11-18 ENCOUNTER — Other Ambulatory Visit: Payer: Self-pay

## 2023-01-03 ENCOUNTER — Ambulatory Visit: Payer: Self-pay | Admitting: Physician Assistant

## 2023-01-03 ENCOUNTER — Ambulatory Visit: Payer: Self-pay

## 2023-01-03 ENCOUNTER — Encounter: Payer: Self-pay | Admitting: Physician Assistant

## 2023-01-03 DIAGNOSIS — Z0000 Encounter for general adult medical examination without abnormal findings: Secondary | ICD-10-CM

## 2023-01-03 DIAGNOSIS — K1379 Other lesions of oral mucosa: Secondary | ICD-10-CM

## 2023-01-03 LAB — POCT URINALYSIS DIPSTICK
Bilirubin, UA: NEGATIVE
Blood, UA: NEGATIVE
Glucose, UA: NEGATIVE
Ketones, UA: NEGATIVE
Leukocytes, UA: NEGATIVE
Nitrite, UA: NEGATIVE
Protein, UA: NEGATIVE
Spec Grav, UA: 1.025 (ref 1.010–1.025)
Urobilinogen, UA: 0.2 E.U./dL
pH, UA: 6 (ref 5.0–8.0)

## 2023-01-03 MED ORDER — AMOXICILLIN 875 MG PO TABS: 875.0000 mg | ORAL_TABLET | Freq: Two times a day (BID) | ORAL | 0 refills | Status: AC

## 2023-01-03 MED ORDER — LIDOCAINE VISCOUS HCL 2 % MT SOLN: 5.0000 mL | Freq: Four times a day (QID) | OROMUCOSAL | 0 refills | Status: DC | PRN

## 2023-01-03 NOTE — Progress Notes (Signed)
Pt completed lab portion of physical./CL,RMA

## 2023-01-03 NOTE — Progress Notes (Addendum)
   Subjective: Mouth pain    Patient ID: Randy Brewer, male    DOB: 30-Mar-1986, 37 y.o.   MRN: [id number]  HPI Patient presents pain to the oral cavity secondary to biting in her cheek while eating.  Incident occurred yesterday.  This is a repetitive injury of the oral cavity that alternates bilateral molar area.   Review of Systems Negative except for chief complaint    Objective:   Physical Exam  Vital signs not taken. There is an area of grayish/ whitish discoloration to the bucca mucosa area of the left cheek.      Assessment & Plan: Mouth injury   Patient given a prescription for viscous lidocaine and amoxicillin.  Advised to consult his dentist since this is a reoccurring incident.

## 2023-01-04 LAB — CMP12+LP+TP+TSH+6AC+CBC/D/PLT
ALT: 22 IU/L (ref 0–44)
AST: 16 IU/L (ref 0–40)
Albumin/Globulin Ratio: 1.9 (ref 1.2–2.2)
Albumin: 4.6 g/dL (ref 4.1–5.1)
Alkaline Phosphatase: 58 IU/L (ref 44–121)
BUN/Creatinine Ratio: 16 (ref 9–20)
BUN: 17 mg/dL (ref 6–20)
Basophils Absolute: 0.1 10*3/uL (ref 0.0–0.2)
Basos: 1 %
Bilirubin Total: 0.8 mg/dL (ref 0.0–1.2)
Calcium: 9.2 mg/dL (ref 8.7–10.2)
Chloride: 103 mmol/L (ref 96–106)
Chol/HDL Ratio: 3.7 ratio (ref 0.0–5.0)
Cholesterol, Total: 203 mg/dL — ABNORMAL HIGH (ref 100–199)
Creatinine, Ser: 1.04 mg/dL (ref 0.76–1.27)
EOS (ABSOLUTE): 0.2 10*3/uL (ref 0.0–0.4)
Eos: 3 %
Estimated CHD Risk: 0.6 times avg. (ref 0.0–1.0)
Free Thyroxine Index: 2.7 (ref 1.2–4.9)
GGT: 19 IU/L (ref 0–65)
Globulin, Total: 2.4 g/dL (ref 1.5–4.5)
Glucose: 88 mg/dL (ref 70–99)
HDL: 55 mg/dL (ref >39)
Hematocrit: 44.9 % (ref 37.5–51.0)
Hemoglobin: 15.3 g/dL (ref 13.0–17.7)
Immature Grans (Abs): 0 10*3/uL (ref 0.0–0.1)
Immature Granulocytes: 0 %
Iron: 123 ug/dL (ref 38–169)
LDH: 141 IU/L (ref 121–224)
LDL Chol Calc (NIH): 134 mg/dL — ABNORMAL HIGH (ref 0–99)
Lymphocytes Absolute: 1 10*3/uL (ref 0.7–3.1)
Lymphs: 16 %
MCH: 28.8 pg (ref 26.6–33.0)
MCHC: 34.1 g/dL (ref 31.5–35.7)
MCV: 85 fL (ref 79–97)
Monocytes Absolute: 0.4 10*3/uL (ref 0.1–0.9)
Monocytes: 6 %
Neutrophils Absolute: 4.7 10*3/uL (ref 1.4–7.0)
Neutrophils: 74 %
Phosphorus: 2.9 mg/dL (ref 2.8–4.1)
Platelets: 282 10*3/uL (ref 150–450)
Potassium: 4.3 mmol/L (ref 3.5–5.2)
RBC: 5.31 x10E6/uL (ref 4.14–5.80)
RDW: 12.7 % (ref 11.6–15.4)
Sodium: 138 mmol/L (ref 134–144)
T3 Uptake Ratio: 33 % (ref 24–39)
T4, Total: 8.3 ug/dL (ref 4.5–12.0)
TSH: 1.41 u[IU]/mL (ref 0.450–4.500)
Total Protein: 7 g/dL (ref 6.0–8.5)
Triglycerides: 80 mg/dL (ref 0–149)
Uric Acid: 3.8 mg/dL (ref 3.8–8.4)
VLDL Cholesterol Cal: 14 mg/dL (ref 5–40)
WBC: 6.4 10*3/uL (ref 3.4–10.8)
eGFR: 94 mL/min/{1.73_m2} (ref >59)

## 2023-01-10 ENCOUNTER — Encounter: Payer: Self-pay | Admitting: Physician Assistant

## 2023-01-10 ENCOUNTER — Ambulatory Visit: Payer: Self-pay | Admitting: Physician Assistant

## 2023-01-10 VITALS — BP 127/77 | HR 64 | Temp 97.3°F | Resp 12 | Ht 71.0 in | Wt 158.0 lb

## 2023-01-10 DIAGNOSIS — Z0000 Encounter for general adult medical examination without abnormal findings: Secondary | ICD-10-CM

## 2023-01-10 NOTE — Progress Notes (Signed)
[facility] ____________________________________________   None    (approximate)  I have reviewed the triage vital signs and the nursing notes.   HISTORY  Chief Complaint Annual Exam   HPI Randy Brewer is a 37 y.o. male who presents for annual physical exam. Appears in good general health, no acute findings on exam.          Past Medical History:  Diagnosis Date   Cerumen impaction    Elevated LDL cholesterol level    Ganglion cyst     Patient Active Problem List   Diagnosis Date Noted   Pain in thoracic spine 05/18/2021    No past surgical history on file.  Prior to Admission medications   Medication Sig Start Date End Date Taking? Authorizing Provider  amoxicillin (AMOXIL) 875 MG tablet Take 1 tablet (875 mg total) by mouth 2 (two) times daily for 10 days. 01/03/23 01/13/23 Yes Sable Feil, PA-C  fexofenadine-pseudoephedrine (ALLEGRA-D) 60-120 MG 12 hr tablet Take 1 tablet by mouth 2 (two) times daily. Patient not taking: Reported on 01/10/2023 12/31/21   Sable Feil, PA-C  lidocaine (XYLOCAINE) 2 % solution Use as directed 5 mLs in the mouth or throat every 6 (six) hours as needed for mouth pain. Patient not taking: Reported on 01/10/2023 01/03/23   Sable Feil, PA-C    Allergies Patient has no known allergies.  No family history on file.  Social History Social History   Tobacco Use   Smoking status: Never   Smokeless tobacco: Never  Vaping Use   Vaping Use: Never used  Substance Use Topics   Alcohol use: Yes   Drug use: Never    Review of Systems Constitutional: No fever/chills Eyes: No visual changes. ENT: No sore throat. Cardiovascular: Denies chest pain. Respiratory: Denies shortness of breath. Gastrointestinal: No abdominal pain.  No nausea, no vomiting.  No diarrhea.  No constipation. Genitourinary: Negative for dysuria. Musculoskeletal: Negative for back pain. Skin: Negative for  rash. Neurological: Negative for headaches, focal weakness or numbness.  ____________________________________________   PHYSICAL EXAM:  VITAL SIGNS: BP 127/77, P 64, T 97.76f, Wt 158lb, BMI 22 Constitutional: Alert and oriented. Well appearing and in no acute distress. Eyes: Conjunctivae are normal. PERRL. EOMI. Head: Atraumatic. Nose: No congestion/rhinnorhea. Mouth/Throat: Mucous membranes are moist.  Oropharynx non-erythematous. Neck: No stridor. No cervical spine tenderness to palpation. Hematological/Lymphatic/Immunilogical: No cervical lymphadenopathy. Cardiovascular: Normal rate, regular rhythm. Grossly normal heart sounds.  Good peripheral circulation. Respiratory: Normal respiratory effort.  No retractions. Lungs CTAB. Gastrointestinal: Soft and nontender. No distention. No abdominal bruits. No CVA tenderness. Musculoskeletal: No lower extremity tenderness nor edema.  No joint effusions. Neurologic:  Normal speech and language. No gross focal neurologic deficits are appreciated. No gait instability. Skin:  Skin is warm, dry and intact. No rash noted. Psychiatric: Mood and affect are normal. Speech and behavior are normal.  ____________________________________________   LABS        Component Ref Range & Units 7 d ago (01/03/23) 1 yr ago (12/25/21) 1 yr ago (03/13/21) 2 yr ago (03/24/20)  Color, UA Dark Yellow medium yellow yellow yellow  Clarity, UA Clear clear clear cloudy  Glucose, UA Negative Negative Negative Negative Negative  Bilirubin, UA Negative negative negative negative  Ketones, UA Negative negative negative negative  Spec Grav, UA 1.010 - 1.025 1.025 1.020 1.025 1.020  Blood, UA Negative negative negative negative  pH, UA 5.0 - 8.0 6.0 6.0 6.0 6.5  Protein,  UA Negative Negative Negative Negative Negative  Urobilinogen, UA 0.2 or 1.0 E.U./dL 0.2 0.2 0.2 0.2  Nitrite, UA Negative negative negative negative  Leukocytes, UA Negative Negative Negative  Negative Negative  Appearance   medium   Odor                       Component Ref Range & Units 7 d ago (01/03/23) 1 yr ago (12/25/21) 1 yr ago (03/13/21) 2 yr ago (03/24/20) 2 yr ago (02/11/20) 2 yr ago (02/11/20) 5 yr ago (04/19/17)  Glucose 70 - 99 mg/dL 88 73 89 R 85 R 148 High  CM    Uric Acid 3.8 - 8.4 mg/dL 3.8 4.8 CM 4.4 CM 4.7 CM     Comment:            Therapeutic target for gout patients: <6.0  BUN 6 - 20 mg/dL 17 17 15 15 23  High     Creatinine, Ser 0.76 - 1.27 mg/dL 1.04 1.13 1.06 1.06 1.17 R    eGFR >59 mL/min/1.73 94 86 93      BUN/Creatinine Ratio 9 - 20 16 15 14 14      Sodium 134 - 144 mmol/L 138 138 143 138 142 R    Potassium 3.5 - 5.2 mmol/L 4.3 4.1 4.2 4.4 3.5 R    Chloride 96 - 106 mmol/L 103 97 99 100 111 R    Calcium 8.7 - 10.2 mg/dL 9.2 9.6 9.5 9.5 8.3 Low  R    Phosphorus 2.8 - 4.1 mg/dL 2.9 3.3 3.1 3.0     Total Protein 6.0 - 8.5 g/dL 7.0 7.8 7.7 7.2 7.4 R    Albumin 4.1 - 5.1 g/dL 4.6 5.0 R 5.1 High  R 4.8 R 4.5 R    Globulin, Total 1.5 - 4.5 g/dL 2.4 2.8 2.6 2.4     Albumin/Globulin Ratio 1.2 - 2.2 1.9 1.8 2.0 2.0     Bilirubin Total 0.0 - 1.2 mg/dL 0.8 0.5 0.7 0.9 1.6 High  R    Alkaline Phosphatase 44 - 121 IU/L 58 88 69 62 R 44 R    LDH 121 - 224 IU/L 141 147 154 152     AST 0 - 40 IU/L 16 22 18 17 23  R    ALT 0 - 44 IU/L 22 25 21 16 30  R    GGT 0 - 65 IU/L 19 30 22 21      Iron 38 - 169 ug/dL 123 77 109 113     Cholesterol, Total 100 - 199 mg/dL 203 High  210 High  214 High  212 High      Triglycerides 0 - 149 mg/dL 80 105 110 94     HDL >39 mg/dL 55 58 52 51     VLDL Cholesterol Cal 5 - 40 mg/dL 14 19 20 17      LDL Chol Calc (NIH) 0 - 99 mg/dL 134 High  133 High  142 High  144 High      Chol/HDL Ratio 0.0 - 5.0 ratio 3.7 3.6 CM 4.1 CM 4.2 CM     Comment:                                   T. Chol/HDL Ratio  Men  Women                               1/2 Avg.Risk  3.4    3.3                                    Avg.Risk  5.0    4.4                                2X Avg.Risk  9.6    7.1                                3X Avg.Risk 23.4   11.0  Estimated CHD Risk 0.0 - 1.0 times avg. 0.6 0.6 CM 0.8 CM 0.8 CM     Comment: The CHD Risk is based on the T. Chol/HDL ratio. Other factors affect CHD Risk such as hypertension, smoking, diabetes, severe obesity, and family history of premature CHD.  TSH 0.450 - 4.500 uIU/mL 1.410 1.880 1.400 1.360     T4, Total 4.5 - 12.0 ug/dL 8.3 10.0 10.0 8.5     T3 Uptake Ratio 24 - 39 % 33 26 27 29      Free Thyroxine Index 1.2 - 4.9 2.7 2.6 2.7 2.5     WBC 3.4 - 10.8 x10E3/uL 6.4 7.6 6.3 5.2  9.9 R 5.2 R  RBC 4.14 - 5.80 x10E6/uL 5.31 5.85 High  5.67 5.50  5.96 High  R 5.41 R  Hemoglobin 13.0 - 17.7 g/dL 15.3 16.6 16.2 15.8  16.9 R 15.4 R  Hematocrit 37.5 - 51.0 % 44.9 49.6 48.6 46.2  48.2 R 44.9 R  MCV 79 - 97 fL 85 85 86 84  80.9 R 83.1 R  MCH 26.6 - 33.0 pg 28.8 28.4 28.6 28.7  28.4 R 28.6 R  MCHC 31.5 - 35.7 g/dL 34.1 33.5 33.3 34.2  35.1 R 34.4 R  RDW 11.6 - 15.4 % 12.7 12.5 12.5 12.5  12.3 R 12.8 R  Platelets 150 - 450 x10E3/uL 282 352 291 305  287 R 186 R  Neutrophils Not Estab. % 74 75 64 62   72 R  Lymphs Not Estab. % 16 17 25 25      Monocytes Not Estab. % 6 6 7 7      Eos Not Estab. % 3 1 3 5      Basos Not Estab. % 1 1 1 1      Neutrophils Absolute 1.4 - 7.0 x10E3/uL 4.7 5.7 4.1 3.3   3.7 R  Lymphocytes Absolute 0.7 - 3.1 x10E3/uL 1.0 1.3 1.5 1.3   0.7 Low  R  Monocytes Absolute 0.1 - 0.9 x10E3/uL 0.4 0.5 0.4 0.4     EOS (ABSOLUTE) 0.0 - 0.4 x10E3/uL 0.2 0.1 0.2 0.2     Basophils Absolute 0.0 - 0.2 x10E3/uL 0.1 0.1 0.1 0.1   0.0 R  Immature Granulocytes Not Estab. % 0 0 0 0                    ____________________________________________  ____________________________________________    ____________________________________________   INITIAL IMPRESSION / ASSESSMENT AND PLAN  As part of my  medical decision making, I reviewed the following data within the electronic  MEDICAL RECORD NUMBER   No acute findings on exam.         ____________________________________________   FINAL CLINICAL IMPRESSION Well exam.    ED Discharge Orders     None        Note:  This document was prepared using Dragon voice recognition software and may include unintentional dictation errors.

## 2024-01-02 ENCOUNTER — Ambulatory Visit: Payer: Self-pay

## 2024-01-02 DIAGNOSIS — Z0000 Encounter for general adult medical examination without abnormal findings: Secondary | ICD-10-CM

## 2024-01-02 LAB — POCT URINALYSIS DIPSTICK
Bilirubin, UA: NEGATIVE
Blood, UA: NEGATIVE
Glucose, UA: NEGATIVE
Ketones, UA: NEGATIVE
Leukocytes, UA: NEGATIVE
Nitrite, UA: NEGATIVE
Protein, UA: NEGATIVE
Spec Grav, UA: 1.025 (ref 1.010–1.025)
Urobilinogen, UA: 0.2 U/dL
pH, UA: 6 (ref 5.0–8.0)

## 2024-01-03 LAB — CMP12+LP+TP+TSH+6AC+PSA+CBC…
ALT: 25 IU/L (ref 0–44)
AST: 24 IU/L (ref 0–40)
Albumin: 4.8 g/dL (ref 4.1–5.1)
Alkaline Phosphatase: 68 IU/L (ref 44–121)
BUN/Creatinine Ratio: 17 (ref 9–20)
BUN: 19 mg/dL (ref 6–20)
Basophils Absolute: 0.1 10*3/uL (ref 0.0–0.2)
Basos: 1 %
Bilirubin Total: 0.7 mg/dL (ref 0.0–1.2)
Calcium: 9.1 mg/dL (ref 8.7–10.2)
Chloride: 100 mmol/L (ref 96–106)
Chol/HDL Ratio: 3.6 ratio (ref 0.0–5.0)
Cholesterol, Total: 218 mg/dL — ABNORMAL HIGH (ref 100–199)
Creatinine, Ser: 1.12 mg/dL (ref 0.76–1.27)
EOS (ABSOLUTE): 0.3 10*3/uL (ref 0.0–0.4)
Eos: 5 %
Estimated CHD Risk: 0.6 times avg. (ref 0.0–1.0)
Free Thyroxine Index: 2.6 (ref 1.2–4.9)
GGT: 20 IU/L (ref 0–65)
Globulin, Total: 2.5 g/dL (ref 1.5–4.5)
Glucose: 88 mg/dL (ref 70–99)
HDL: 60 mg/dL (ref >39)
Hematocrit: 47.7 % (ref 37.5–51.0)
Hemoglobin: 16.1 g/dL (ref 13.0–17.7)
Immature Grans (Abs): 0 10*3/uL (ref 0.0–0.1)
Immature Granulocytes: 0 %
Iron: 108 ug/dL (ref 38–169)
LDH: 152 IU/L (ref 121–224)
LDL Chol Calc (NIH): 143 mg/dL — ABNORMAL HIGH (ref 0–99)
Lymphocytes Absolute: 1.2 10*3/uL (ref 0.7–3.1)
Lymphs: 19 %
MCH: 29.5 pg (ref 26.6–33.0)
MCHC: 33.8 g/dL (ref 31.5–35.7)
MCV: 88 fL (ref 79–97)
Monocytes Absolute: 0.4 10*3/uL (ref 0.1–0.9)
Monocytes: 7 %
Neutrophils Absolute: 4.3 10*3/uL (ref 1.4–7.0)
Neutrophils: 68 %
Phosphorus: 3 mg/dL (ref 2.8–4.1)
Platelets: 313 10*3/uL (ref 150–450)
Potassium: 4.3 mmol/L (ref 3.5–5.2)
Prostate Specific Ag, Serum: 0.4 ng/mL (ref 0.0–4.0)
RBC: 5.45 x10E6/uL (ref 4.14–5.80)
RDW: 12.8 % (ref 11.6–15.4)
Sodium: 140 mmol/L (ref 134–144)
T3 Uptake Ratio: 29 % (ref 24–39)
T4, Total: 8.8 ug/dL (ref 4.5–12.0)
TSH: 1.41 u[IU]/mL (ref 0.450–4.500)
Total Protein: 7.3 g/dL (ref 6.0–8.5)
Triglycerides: 83 mg/dL (ref 0–149)
Uric Acid: 4.2 mg/dL (ref 3.8–8.4)
VLDL Cholesterol Cal: 15 mg/dL (ref 5–40)
WBC: 6.3 10*3/uL (ref 3.4–10.8)
eGFR: 86 mL/min/{1.73_m2} (ref >59)

## 2024-01-11 ENCOUNTER — Encounter: Payer: Self-pay | Admitting: Physician Assistant

## 2024-01-11 ENCOUNTER — Ambulatory Visit: Payer: Self-pay | Admitting: Physician Assistant

## 2024-01-11 VITALS — BP 123/76 | HR 65 | Temp 97.7°F | Resp 16 | Ht 71.0 in | Wt 160.0 lb

## 2024-01-11 DIAGNOSIS — Z0000 Encounter for general adult medical examination without abnormal findings: Secondary | ICD-10-CM

## 2024-01-11 NOTE — Progress Notes (Signed)
 [facility] ____________________________________________   None    (approximate)  I have reviewed the triage vital signs and the nursing notes.   HISTORY  Chief Complaint No chief complaint on file.   HPI Randy Brewer is a 38 y.o. male patient presents for annual physical exam.  Voices no concerns or complaints.          Patient Active Problem List   Diagnosis Date Noted   Pain in thoracic spine 05/18/2021    No past surgical history on file.  Prior to Admission medications   Medication Sig Start Date End Date Taking? Authorizing Provider  fexofenadine-pseudoephedrine (ALLEGRA-D) 60-120 MG 12 hr tablet Take 1 tablet by mouth 2 (two) times daily. Patient not taking: Reported on 01/10/2023 12/31/21   Joni Reining, PA-C  lidocaine (XYLOCAINE) 2 % solution Use as directed 5 mLs in the mouth or throat every 6 (six) hours as needed for mouth pain. Patient not taking: Reported on 01/10/2023 01/03/23   Joni Reining, PA-C    Allergies Patient has no known allergies.  No family history on file.  Social History Social History   Tobacco Use   Smoking status: Never   Smokeless tobacco: Never  Vaping Use   Vaping status: Never Used  Substance Use Topics   Alcohol use: Yes   Drug use: Never    Review of Systems Constitutional: No fever/chills Eyes: No visual changes. ENT: No sore throat. Cardiovascular: Denies chest pain. Respiratory: Denies shortness of breath. Gastrointestinal: No abdominal pain.  No nausea, no vomiting.  No diarrhea.  No constipation. Genitourinary: Negative for dysuria. Musculoskeletal: Negative for back pain. Skin: Negative for rash. Neurological: Negative for headaches, focal weakness or numbness.  ____________________________________________   PHYSICAL EXAM:  VITAL SIGNS: BP 123/76  Cuff Size Normal  Pulse Rate 65  Temp 97.7 F (36.5 C)  Temp Source Temporal  Weight 160 lb (72.6  kg)  Height 5\' 11"  (1.803 m)  Resp 16  SpO2 99 %   BMI: 22.32 kg/m2  BSA: 1.91 m2   Constitutional: Alert and oriented. Well appearing and in no acute distress. Eyes: Conjunctivae are normal. PERRL. EOMI. Head: Atraumatic. Nose: No congestion/rhinnorhea. Mouth/Throat: Mucous membranes are moist.  Oropharynx non-erythematous. Neck: No stridor.  No cervical spine tenderness to palpation. Hematological/Lymphatic/Immunilogical: No cervical lymphadenopathy. Cardiovascular: Normal rate, regular rhythm. Grossly normal heart sounds.  Good peripheral circulation. Respiratory: Normal respiratory effort.  No retractions. Lungs CTAB. Gastrointestinal: Soft and nontender. No distention. No abdominal bruits. No CVA tenderness. Genitourinary: Deferred Musculoskeletal: No lower extremity tenderness nor edema.  No joint effusions. Neurologic:  Normal speech and language. No gross focal neurologic deficits are appreciated. No gait instability. Skin:  Skin is warm, dry and intact. No rash noted. Psychiatric: Mood and affect are normal. Speech and behavior are normal.  ____________________________________________   LABS            Component Ref Range & Units (hover) 9 d ago (01/02/24) 1 yr ago (01/03/23) 2 yr ago (12/25/21) 2 yr ago (03/13/21) 3 yr ago (03/24/20) 3 yr ago (02/11/20) 3 yr ago (02/11/20)  Glucose 88 88 73 89 R 85 R 148 High  CM   Uric Acid 4.2 3.8 CM 4.8 CM 4.4 CM 4.7 CM    Comment:            Therapeutic target for gout patients: <6.0  BUN 19 17 17 15 15 23  High    Creatinine, Ser 1.12 1.04  1.13 1.06 1.06 1.17 R   eGFR 86 94 86 93     BUN/Creatinine Ratio 17 16 15 14 14     Sodium 140 138 138 143 138 142 R   Potassium 4.3 4.3 4.1 4.2 4.4 3.5 R   Chloride 100 103 97 99 100 111 R   Calcium 9.1 9.2 9.6 9.5 9.5 8.3 Low  R   Phosphorus 3.0 2.9 3.3 3.1 3.0    Total Protein 7.3 7.0 7.8 7.7 7.2 7.4 R   Albumin 4.8 4.6 5.0 R 5.1 High  R 4.8 R 4.5 R   Globulin, Total 2.5 2.4 2.8 2.6 2.4     Bilirubin Total 0.7 0.8 0.5 0.7 0.9 1.6 High  R   Alkaline Phosphatase 68 58 88 69 62 R 44 R   LDH 152 141 147 154 152    AST 24 16 22 18 17 23  R   ALT 25 22 25 21 16 30  R   GGT 20 19 30 22 21     Iron 108 123 77 109 113    Cholesterol, Total 218 High  203 High  210 High  214 High  212 High     Triglycerides 83 80 105 110 94    HDL 60 55 58 52 51    VLDL Cholesterol Cal 15 14 19 20 17     LDL Chol Calc (NIH) 143 High  134 High  133 High  142 High  144 High     Chol/HDL Ratio 3.6 3.7 CM 3.6 CM 4.1 CM 4.2 CM    Comment:                                   T. Chol/HDL Ratio                                             Men  Women                               1/2 Avg.Risk  3.4    3.3                                   Avg.Risk  5.0    4.4                                2X Avg.Risk  9.6    7.1                                3X Avg.Risk 23.4   11.0  Estimated CHD Risk 0.6 0.6 CM 0.6 CM 0.8 CM 0.8 CM    Comment: The CHD Risk is based on the T. Chol/HDL ratio. Other factors affect CHD Risk such as hypertension, smoking, diabetes, severe obesity, and family history of premature CHD.  TSH 1.410 1.410 1.880 1.400 1.360    T4, Total 8.8 8.3 10.0 10.0 8.5    T3 Uptake Ratio 29 33 26 27 29     Free Thyroxine Index 2.6 2.7 2.6 2.7 2.5    Prostate Specific  Ag, Serum 0.4        Comment: Roche ECLIA methodology. According to the American Urological Association, Serum PSA should decrease and remain at undetectable levels after radical prostatectomy. The AUA defines biochemical recurrence as an initial PSA value 0.2 ng/mL or greater followed by a subsequent confirmatory PSA value 0.2 ng/mL or greater. Values obtained with different assay methods or kits cannot be used interchangeably. Results cannot be interpreted as absolute evidence of the presence or absence of malignant disease.  WBC 6.3 6.4 7.6 6.3 5.2  9.9 R  RBC 5.45 5.31 5.85 High  5.67 5.50  5.96 High  R  Hemoglobin 16.1 15.3 16.6 16.2 15.8   16.9 R  Hematocrit 47.7 44.9 49.6 48.6 46.2  48.2 R  MCV 88 85 85 86 84  80.9 R  MCH 29.5 28.8 28.4 28.6 28.7  28.4 R  MCHC 33.8 34.1 33.5 33.3 34.2  35.1 R  RDW 12.8 12.7 12.5 12.5 12.5  12.3 R  Platelets 313 282 352 291 305  287 R  Neutrophils 68 74 75 64 62    Lymphs 19 16 17 25 25     Monocytes 7 6 6 7 7     Eos 5 3 1 3 5     Basos 1 1 1 1 1     Neutrophils Absolute 4.3 4.7 5.7 4.1 3.3    Lymphocytes Absolute 1.2 1.0 1.3 1.5 1.3    Monocytes Absolute 0.4 0.4 0.5 0.4 0.4    EOS (ABSOLUTE) 0.3 0.2 0.1 0.2 0.2    Basophils Absolute 0.1 0.1 0.1 0.1 0.1    Immature Granulocytes 0 0 0 0 0    Immature Grans (Abs) 0.0 0.0 0.0 0.0 0.0                ____________________________________________  EKG  Sinus rhythm at 64 bpm.  Left axis anterior fascicular block. ____________________________________________    ____________________________________________   INITIAL IMPRESSION / ASSESSMENT AND PLAN  As part of my medical decision making, I reviewed the following data within the electronic MEDICAL RECORD NUMBER      No acute findings on physical exam.  Asymptomatic anterior fascicular block.  Cholesterol elevated 218.  Triglycerides and HDL within normal limits.  Advised        ____________________________________________   FINAL CLINICAL IMPRESSION     ED Discharge Orders     None        Note:  This document was prepared using Dragon voice recognition software and may include unintentional dictation errors.

## 2024-01-11 NOTE — Progress Notes (Signed)
 Pt presents today to complete physical, Pt states right bottom side of ribs is tender. Not sure if he did any by working out over the past couple months or coming from his [place] (never had issues before) certain movements causes discomfort. (Dull pain)

## 2024-06-11 ENCOUNTER — Ambulatory Visit (INDEPENDENT_AMBULATORY_CARE_PROVIDER_SITE_OTHER): Admitting: Dermatology

## 2024-06-11 ENCOUNTER — Encounter: Payer: Self-pay | Admitting: Dermatology

## 2024-06-11 DIAGNOSIS — L811 Chloasma: Secondary | ICD-10-CM | POA: Diagnosis not present

## 2024-06-11 DIAGNOSIS — W908XXA Exposure to other nonionizing radiation, initial encounter: Secondary | ICD-10-CM

## 2024-06-11 DIAGNOSIS — L578 Other skin changes due to chronic exposure to nonionizing radiation: Secondary | ICD-10-CM | POA: Diagnosis not present

## 2024-06-11 MED ORDER — RETIN-A 0.025 % EX CREA: TOPICAL_CREAM | CUTANEOUS | 11 refills | Status: AC

## 2024-06-11 NOTE — Progress Notes (Signed)
 New Patient Visit   Subjective  Randy Brewer is a 38 y.o. male who presents for the following: Pt c/o darkening of the skin on the face, temples and scalp, and he would like to discuss treatment options. No hx of skin cancer or abnormal nevi. Hx of bx in 2015 by Dr. Jackquline, pathology showed benign nevus on the L parietal scalp.  The following portions of the chart were reviewed this encounter and updated as appropriate: medications, allergies, medical history  Review of Systems:  No other skin or systemic complaints except as noted in HPI or Assessment and Plan.  Objective  Well appearing patient in no apparent distress; mood and affect are within normal limits.   A focused examination was performed of the following areas:   Relevant exam findings are noted in the Assessment and Plan.    Assessment & Plan                 MELASMA Exam: reticulated hyperpigmented patches at lateral cheeks and temples  Chronic and persistent condition with duration or expected duration over one year. Condition is bothersome for patient. Currently flared.  Melasma is a chronic; persistent condition of hyperpigmented patches generally on the face, worse in summer due to higher UV exposure.    Heredity; thyroid disease; sun exposure; pregnancy; birth control pills; epilepsy medication and darker skin may predispose to Melasma.   Recommendations include: - Sun avoidance and daily broad spectrum (UVA/UVB) tinted mineral sunscreen SPF 30+, with Zinc or Titanium Dioxide. - Rx topical bleaching creams (i.e. hydroquinone) is a common treatment but should not be used long term.  Hydroquinones may be mixed with retinoids; vitamin C; steroids; Kojic Acid. - Alastin A-luminate, retinoids, vitamin C, topical tranexamic acid, glycolic acid and kojic acid can be used for brightening while on break from hydroquinone - Rx Azelaic Acid is also a treatment option that is safe for pregnancy  (Category B). - OTC Heliocare can be helpful in control and prevention. - Oral Rx with Tranexamic Acid 250 mg - 650 mg po daily can be used for moderate to severe cases especially during summer (contraindications include pregnancy; lactation; hx of PE; hx of DVT; clotting disorder; heart disease; anticoagulant use and upcoming long trips)   - Chemical peels (would need multiple for best result).  - Lasers and  Microdermabrasion may also be helpful adjunct treatments.  Treatment Plan: Recommend OTC Inkey Tranexamic Acid cream to apply to hyperpigmented areas on face at night.  Can buy at [employer], [employer], or online for around $15.  Start Tretinoin  0.025% cream to face and scalp at night. Start using twice per week for month, with several days in between. After one month add one additional night per week as tolerated. Topical steroids (such as triamcinolone, fluocinolone, fluocinonide, mometasone, clobetasol, halobetasol, betamethasone, hydrocortisone) can cause thinning and lightening of the skin if they are used for too long in the same area. Your physician has selected the right strength medicine for your problem and area affected on the body. Please use your medication only as directed by your physician to prevent side effects.   ACTINIC DAMAGE - chronic, secondary to cumulative UV radiation exposure/sun exposure over time - diffuse scaly erythematous macules with underlying dyspigmentation on scalp, lateral neck b/l - Recommend daily broad spectrum sunscreen SPF 30+ to sun-exposed areas, reapply every 2 hours as needed.  - Recommend staying in the shade or wearing long sleeves, sun glasses (UVA+UVB protection) and wide brim hats (4-inch  brim around the entire circumference of the hat). - Call for new or changing lesions. MELASMA   Related Medications RETIN-A  0.025 % cream Start Tretinoin  0.025% cream to face and scalp at night. Start using twice per week for month, with several days in between  use. After one month add one additional night per week as tolerated. ACTINIC ELASTOSIS    Return if symptoms worsen or fail to improve, for BBL of the face with Dr. Jackquline or Dr. Hester.  LILLETTE Rosina Mayans, CMA, am acting as scribe for Boneta Sharps, MD .  Documentation: I have reviewed the above documentation for accuracy and completeness, and I agree with the above.  Boneta Sharps, MD

## 2024-06-11 NOTE — Patient Instructions (Addendum)
 MELASMA Exam: reticulated hyperpigmented patches at face  Melasma is a chronic; persistent condition of hyperpigmented patches generally on the face, worse in summer due to higher UV exposure.    Heredity; thyroid disease; sun exposure; pregnancy; birth control pills; epilepsy medication and darker skin may predispose to Melasma.   Recommendations include: - Sun avoidance and daily broad spectrum (UVA/UVB) tinted mineral sunscreen SPF 30+, with Zinc or Titanium Dioxide. - Rx topical bleaching creams (i.e. hydroquinone) is a common treatment but should not be used long term.  Hydroquinones may be mixed with retinoids; vitamin C; steroids; Kojic Acid. - Alastin A-luminate, retinoids, vitamin C, topical tranexamic acid, glycolic acid and kojic acid can be used for brightening while on break from hydroquinone - Rx Azelaic Acid is also a treatment option that is safe for pregnancy (Category B). - OTC Heliocare can be helpful in control and prevention. - Oral Rx with Tranexamic Acid 250 mg - 650 mg po daily can be used for moderate to severe cases especially during summer (contraindications include pregnancy; lactation; hx of PE; hx of DVT; clotting disorder; heart disease; anticoagulant use and upcoming long trips)   - Chemical peels (would need multiple for best result).  - Lasers and  Microdermabrasion may also be helpful adjunct treatments.  Treatment Plan: Recommend OTC Inkey Tranexamic Acid cream to apply to hyperpigmented areas on face at night.  Can buy at [employer], [employer], or online for around $15.  Start Tretinoin  0.025% cream to face and scalp at night. Start using twice per week for month, with several days in between. After one month add one additional night per week as tolerated. Topical retinoid medications like tretinoin /Retin-A , adapalene/Differin, tazarotene/Fabior, and Epiduo/Epiduo Forte can cause dryness and irritation when first started. Only apply a pea-sized amount to the entire  affected area. Avoid applying it around the eyes, edges of mouth and creases at the nose. If you experience irritation, use a good moisturizer first and/or apply the medicine less often. If you are doing well with the medicine, you can increase how often you use it until you are applying every night. Be careful with sun protection while using this medication as it can make you sensitive to the sun. This medicine should not be used by pregnant women.   Due to recent changes in healthcare laws, you may see results of your pathology and/or laboratory studies on MyChart before the doctors have had a chance to review them. We understand that in some cases there may be results that are confusing or concerning to you. Please understand that not all results are received at the same time and often the doctors may need to interpret multiple results in order to provide you with the best plan of care or course of treatment. Therefore, we ask that you please give us  2 business days to thoroughly review all your results before contacting the office for clarification. Should we see a critical lab result, you will be contacted sooner.   If You Need Anything After Your Visit  If you have any questions or concerns for your doctor, please call our main line at [phone] and press option 4 to reach your doctor's medical assistant. If no one answers, please leave a voicemail as directed and we will return your call as soon as possible. Messages left after 4 pm will be answered the following business day.   You may also send us  a message via MyChart. We typically respond to MyChart messages within 1-2 business days.  For prescription refills, please ask your pharmacy to contact our office. Our fax number is [phone].  If you have an urgent issue when the clinic is closed that cannot wait until the next business day, you can page your doctor at the number below.    Please note that while we do our best to be  available for urgent issues outside of office hours, we are not available 24/7.   If you have an urgent issue and are unable to reach us , you may choose to seek medical care at your doctor's office, retail clinic, urgent care center, or emergency room.  If you have a medical emergency, please immediately call 911 or go to the emergency department.  Pager Numbers  - Dr. Hester: [phone]  - Dr. Jackquline: [phone]  - Dr. Claudene: [phone]   - Dr. Raymund: [phone]  In the event of inclement weather, please call our main line at [phone] for an update on the status of any delays or closures.  Dermatology Medication Tips: Please keep the boxes that topical medications come in in order to help keep track of the instructions about where and how to use these. Pharmacies typically print the medication instructions only on the boxes and not directly on the medication tubes.   If your medication is too expensive, please contact our office at [phone] option 4 or send us  a message through MyChart.   We are unable to tell what your co-pay for medications will be in advance as this is different depending on your insurance coverage. However, we may be able to find a substitute medication at lower cost or fill out paperwork to get insurance to cover a needed medication.   If a prior authorization is required to get your medication covered by your insurance company, please allow us  1-2 business days to complete this process.  Drug prices often vary depending on where the prescription is filled and some pharmacies may offer cheaper prices.  The website www.goodrx.com contains coupons for medications through different pharmacies. The prices here do not account for what the cost may be with help from insurance (it may be cheaper with your insurance), but the website can give you the price if you did not use any insurance.  - You can print the associated coupon and take it with your  prescription to the pharmacy.  - You may also stop by our office during regular business hours and pick up a GoodRx coupon card.  - If you need your prescription sent electronically to a different pharmacy, notify our office through [facility] or by phone at [phone] option 4.     Si Usted Necesita Algo Despus de Su Visita  Tambin puede enviarnos un mensaje a travs de Clinical cytogeneticist. Por lo general respondemos a los mensajes de MyChart en el transcurso de 1 a 2 das hbiles.  Para renovar recetas, por favor pida a su farmacia que se ponga en contacto con nuestra oficina. Randi lakes de fax es [place] [phone].  Si tiene un asunto urgente cuando la clnica est cerrada y que no puede esperar hasta el siguiente da hbil, puede llamar/localizar a su doctor(a) al nmero que aparece a continuacin.   Por favor, tenga en cuenta que aunque hacemos todo lo posible para estar disponibles para asuntos urgentes fuera del horario de [place], no estamos disponibles las 24 horas del da, los 7 [address]  [address] de la [place].   Si tiene un problema urgente y no puede comunicarse con nosotros, puede optar  por buscar atencin mdica  en el consultorio de su doctor(a), en una clnica privada, en un centro de atencin urgente o en una sala de emergencias.  Si tiene Engineer, drilling, por favor llame inmediatamente al 911 o vaya a la sala de emergencias.  Nmeros de bper  - Dr. Hester: [phone]  - Dra. Jackquline: [phone]  - Dr. Claudene: [phone]  - Dra. Kitts: [phone]  En caso de inclemencias del [place], por favor llame a nuestra lnea principal al [phone] para una actualizacin sobre el estado de cualquier retraso o cierre.  Consejos para la medicacin en dermatologa: Por favor, guarde las cajas en las que vienen los medicamentos de uso tpico para ayudarle a seguir las instrucciones sobre dnde y cmo usarlos. Las farmacias generalmente imprimen las instrucciones del  medicamento slo en las cajas y no directamente en los tubos del [place].   Si su medicamento es muy caro, por favor, pngase en contacto con landry rieger llamando al [phone] y presione la opcin 4 o envenos un mensaje a travs de Clinical cytogeneticist.   No podemos decirle cul ser su copago por los medicamentos por adelantado ya que esto es diferente dependiendo de la cobertura de su seguro. Sin embargo, es posible que podamos encontrar un medicamento sustituto a Audiological scientist un formulario para que el seguro cubra el medicamento que se considera necesario.   Si se requiere una autorizacin previa para que su compaa de seguros malta su medicamento, por favor permtanos de 1 a 2 das hbiles para completar este proceso.  Los precios de los medicamentos varan con frecuencia dependiendo del Environmental consultant de dnde se surte la receta y alguna farmacias pueden ofrecer precios ms baratos.  El sitio web www.goodrx.com tiene cupones para medicamentos de Health and safety inspector. Los precios aqu no tienen en cuenta lo que podra costar con la ayuda del seguro (puede ser ms barato con su seguro), pero el sitio web puede darle el precio si no utiliz Tourist information centre manager.  - Puede imprimir el cupn correspondiente y llevarlo con su receta a la farmacia.  - Tambin puede pasar por nuestra oficina durante el horario de atencin regular y Education officer, museum una tarjeta de cupones de GoodRx.  - Si necesita que su receta se enve electrnicamente a una farmacia diferente, informe a nuestra oficina a travs de MyChart de [place] o por telfono llamando al [phone] y presione la opcin 4.

## 2024-09-17 ENCOUNTER — Encounter: Payer: Self-pay | Admitting: Physician Assistant

## 2024-09-17 ENCOUNTER — Ambulatory Visit: Payer: Self-pay | Admitting: Physician Assistant

## 2024-09-17 VITALS — BP 119/83 | HR 68 | Temp 97.3°F | Resp 14

## 2024-09-17 DIAGNOSIS — H6091 Unspecified otitis externa, right ear: Secondary | ICD-10-CM

## 2024-09-17 MED ORDER — PSEUDOEPHEDRINE HCL ER 120 MG PO TB12: 120.0000 mg | ORAL_TABLET | Freq: Two times a day (BID) | ORAL | 0 refills | Status: AC

## 2024-09-17 MED ORDER — NEOMYCIN-POLYMYXIN-HC 3.5-10000-1 OT SOLN: 4.0000 [drp] | Freq: Four times a day (QID) | OTIC | 0 refills | Status: DC

## 2024-09-17 NOTE — Progress Notes (Signed)
   Subjective: Right ear pain    Patient ID: Randy Brewer, male    DOB: 1985/11/09, 38 y.o.   MRN: [id number]  HPI Patient complaining of right ear pain for approximately 3 days.  Denies hearing loss or vertigo.  No palliative measures for complaint.   Review of Systems Negative except for above complaint    Objective:   Physical Exam BP 119/83  Pulse Rate 68  Temp 97.3 F (36.3 C)  Resp 14  SpO2 96 %  HEENT is remarkable for edema and erythema to the right canal.  TM is visible. Neck is supple for lymphadenopathy or bruits. Lungs are clear to auscultation. Heart regular rate and rhythm.       Assessment & Plan: Otitis tinea  Patient given a prescription for Cortisporin and Sudafed.  Patient will follow-up in 3 days if there is no improvement or worsening of complaint.

## 2024-09-17 NOTE — Progress Notes (Signed)
 Complains of ears feeling full with some mild ringing in the right ear and complaints started about 3 days ago.  No fever and not using any OTC's.

## 2024-10-03 ENCOUNTER — Encounter: Payer: Self-pay | Admitting: Physician Assistant

## 2024-10-03 ENCOUNTER — Ambulatory Visit: Payer: Self-pay | Admitting: Physician Assistant

## 2024-10-03 VITALS — BP 131/84 | HR 64 | Temp 97.8°F | Resp 14

## 2024-10-03 DIAGNOSIS — H9203 Otalgia, bilateral: Secondary | ICD-10-CM

## 2024-10-03 MED ORDER — METHYLPREDNISOLONE 4 MG PO TBPK: ORAL_TABLET | ORAL | 0 refills | Status: AC

## 2024-10-03 NOTE — Progress Notes (Signed)
° °  Subjective: Ear pain    Patient ID: Randy Brewer, male    DOB: 03-25-1986, 38 y.o.   MRN: [id number]  HPI Patient is follow-up for ear pain from visit on 09/17/2024.  Patient states pain in ear canal has resolved with the eardrops.  States continue to have mild discomfort.  Denies hearing loss or vertigo.  Patient relates today that he forgot to tell me that he went to a concert prior to onset of his complaint on 09/17/2024.  States the music was very loud.  Noticed ear pain 2 to 3 days after going to the concert.   Review of Systems Negative save for above complaint    Objective:   Physical Exam BP 131/84  Pulse Rate 64  Temp 97.8 F (36.6 C)  Resp 14  SpO2 100 %  HEENT is remarkable only for bilateral TM edema.  No erythema.  No drainage. Neck is supple without lymphadenopathy or bruits. Lungs clear to auscultation. Heart regular rate and rhythm.       Assessment & Plan: Otalgia  Patient advised to continue taking Sudafed and a prescription for Medrol  Dosepak was sent to pharmacy.  Patient advised to follow-up in 1 week.  If no improvement will consult to ENT.

## 2024-10-03 NOTE — Progress Notes (Signed)
 Here for follow up post ear drops and stated some improvement, but still noticing not at baseline with both ears and denies any hearing issues.

## 2024-10-09 ENCOUNTER — Encounter: Payer: Self-pay | Admitting: Physician Assistant

## 2024-10-09 ENCOUNTER — Ambulatory Visit: Payer: Self-pay | Admitting: Physician Assistant

## 2024-10-09 VITALS — BP 122/86 | HR 65 | Temp 97.3°F | Resp 12

## 2024-10-09 DIAGNOSIS — H9203 Otalgia, bilateral: Secondary | ICD-10-CM

## 2024-10-09 NOTE — Progress Notes (Signed)
 Will complete the steroid dose pack today (has one last pill to take).  Continues to take Sudafed  Follow-up from 10/03/2024  States ears don't feel quite right - not any sharp pain. Discomfort is intermittent. Denies any drainage from ears

## 2024-10-09 NOTE — Progress Notes (Signed)
" ° °  Subjective: Bilateral ear discomfort    Patient ID: Randy Brewer, male    DOB: July 06, 1986, 38 y.o.   MRN: [id number]  HPI Patient is follow-up approximately 3 weeks of bilateral otalgia.  Denies vertigo or hearing loss.  Patient has a history of tinnitus but no increased although the complaint persists.  Patient is currently on his last dose of Medrol  Dosepak and continue decongestant as directed.   Review of Systems Negative except for above complaint    Objective:   Physical Exam BP 122/86  BP Location Left Arm  Patient Position Sitting  Cuff Size Normal  Pulse Rate 65  Temp 97.3 F (36.3 C)  Temp Source Temporal  Resp 12  SpO2 100 %  HEENT grossly unremarkable. Neck is supple for lymphadenopathy or bruits. Lungs are clear to auscultation. Heart regular rate and rhythm.       Assessment & Plan: otalgia  Discussed with patient no objective acute findings on physical exam.  Advised to monitor condition for another month and follow-up if no improvement.  "
